# Patient Record
Sex: Male | Born: 1995 | Race: Black or African American | Hispanic: No | Smoking: Never smoker
Health system: Southern US, Community
[De-identification: ages and names within clinical notes are randomized; demographics above are authoritative.]

## PROBLEM LIST (undated history)

## (undated) DIAGNOSIS — J45909 Unspecified asthma, uncomplicated: Secondary | ICD-10-CM

## (undated) DIAGNOSIS — F988 Other specified behavioral and emotional disorders with onset usually occurring in childhood and adolescence: Secondary | ICD-10-CM

## (undated) DIAGNOSIS — L309 Dermatitis, unspecified: Secondary | ICD-10-CM

## (undated) HISTORY — DX: Dermatitis, unspecified: L30.9

## (undated) HISTORY — DX: Other specified behavioral and emotional disorders with onset usually occurring in childhood and adolescence: F98.8

---
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
## 2015-01-24 ENCOUNTER — Other Ambulatory Visit: Payer: Self-pay | Admitting: Internal Medicine

## 2015-01-26 NOTE — Telephone Encounter (Signed)
Denied.  Needs an appointment.  Pt's mother has been contacted in the past.

## 2015-01-30 ENCOUNTER — Other Ambulatory Visit: Payer: Self-pay | Admitting: Internal Medicine

## 2015-01-30 NOTE — Telephone Encounter (Signed)
Denied.  Pt needs to be seen in the office.

## 2015-07-29 ENCOUNTER — Telehealth: Payer: Self-pay | Admitting: Internal Medicine

## 2015-07-29 MED ORDER — ALBUTEROL SULFATE HFA 108 (90 BASE) MCG/ACT IN AERS: INHALATION_SPRAY | RESPIRATORY_TRACT | Status: DC

## 2015-07-29 NOTE — Telephone Encounter (Signed)
Please see message and advise 
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
## 2015-07-29 NOTE — Telephone Encounter (Signed)
He has not been seen in over a year  Can refill albuterol x 1     Needs ov before  More refills    Have  Ov scheduled  He is now 19 make sure there is a DRp in system.    .Marland Kitchen

## 2015-07-29 NOTE — Telephone Encounter (Signed)
Called and got pt's mobile number from mother due to no DPR in chart. Called [phone] and left message to call office. Rx sent to pharmacy, pt needs to schedule appt.

## 2015-07-30 NOTE — Telephone Encounter (Signed)
Left message on voicemail to call office.  

## 2015-09-16 ENCOUNTER — Telehealth: Payer: Self-pay | Admitting: Internal Medicine
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
## 2015-09-16 NOTE — Telephone Encounter (Signed)
Should go to ed  I agree.

## 2015-09-16 NOTE — Telephone Encounter (Signed)
Patient & his mom are on the way to [facility] ED.  See below.

## 2015-09-16 NOTE — Telephone Encounter (Signed)
No ED note in the EHR.  Left a message for a return call.

## 2015-11-12 ENCOUNTER — Ambulatory Visit (HOSPITAL_COMMUNITY)
Admission: EM | Admit: 2015-11-12 | Discharge: 2015-11-12 | Disposition: A | Discharge status: Home / Self Care | Payer: Federal, State, Local not specified - PPO | Attending: Family Medicine | Admitting: Family Medicine

## 2015-11-12 ENCOUNTER — Ambulatory Visit (INDEPENDENT_AMBULATORY_CARE_PROVIDER_SITE_OTHER): Payer: Federal, State, Local not specified - PPO

## 2015-11-12 ENCOUNTER — Encounter (HOSPITAL_COMMUNITY): Payer: Self-pay

## 2015-11-12 DIAGNOSIS — S6291XA Unspecified fracture of right wrist and hand, initial encounter for closed fracture: Secondary | ICD-10-CM

## 2015-11-12 MED ORDER — CEPHALEXIN 500 MG PO CAPS: 500.0000 mg | ORAL_CAPSULE | Freq: Three times a day (TID) | ORAL | Status: DC

## 2015-11-12 MED ORDER — MUPIROCIN CALCIUM 2 % EX CREA
1.0000 | TOPICAL_CREAM | Freq: Two times a day (BID) | CUTANEOUS | Status: DC
Start: 2015-11-12 — End: 2016-04-29
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
## 2015-11-12 NOTE — Progress Notes (Signed)
Orthopedic Tech Progress Note Patient Details:  Randall Good 1996-04-07 [id number]  Ortho Devices Type of Ortho Device: Ulna gutter splint Ortho Device/Splint Location: rue Ortho Device/Splint Interventions: Ordered, Application   Randall Good, Randall Good 11/12/2015, 9:26 PM

## 2015-11-12 NOTE — Discharge Instructions (Signed)
Cast or Splint Care °Casts and splints support injured limbs and keep bones from moving while they heal. It is important to care for your cast or splint at home.   °HOME CARE INSTRUCTIONS °· Keep the cast or splint uncovered during the drying period. It can take 24 to 48 hours to dry if it is made of plaster. A fiberglass cast will dry in less than 1 hour. °· Do not rest the cast on anything harder than a pillow for the first 24 hours. °· Do not put weight on your injured limb or apply pressure to the cast until your health care provider gives you permission. °· Keep the cast or splint dry. Wet casts or splints can lose their shape and may not support the limb as well. A wet cast that has lost its shape can also create harmful pressure on your skin when it dries. Also, wet skin can become infected. °¨ Cover the cast or splint with a plastic bag when bathing or when out in the rain or snow. If the cast is on the trunk of the body, take sponge baths until the cast is removed. °¨ If your cast does become wet, dry it with a towel or a blow dryer on the cool setting only. °· Keep your cast or splint clean. Soiled casts may be wiped with a moistened cloth. °· Do not place any hard or soft foreign objects under your cast or splint, such as cotton, toilet paper, lotion, or powder. °· Do not try to scratch the skin under the cast with any object. The object could get stuck inside the cast. Also, scratching could lead to an infection. If itching is a problem, use a blow dryer on a cool setting to relieve discomfort. °· Do not trim or cut your cast or remove padding from inside of it. °· Exercise all joints next to the injury that are not immobilized by the cast or splint. For example, if you have a long leg cast, exercise the hip joint and toes. If you have an arm cast or splint, exercise the shoulder, elbow, thumb, and fingers. °· Elevate your injured arm or leg on 1 or 2 pillows for the first 1 to 3 days to decrease  swelling and pain. It is best if you can comfortably elevate your cast so it is higher than your heart. °SEEK MEDICAL CARE IF:  °· Your cast or splint cracks. °· Your cast or splint is too tight or too loose. °· You have unbearable itching inside the cast. °· Your cast becomes wet or develops a soft spot or area. °· You have a bad smell coming from inside your cast. °· You get an object stuck under your cast. °· Your skin around the cast becomes red or raw. °· You have new pain or worsening pain after the cast has been applied. °SEEK IMMEDIATE MEDICAL CARE IF:  °· You have fluid leaking through the cast. °· You are unable to move your fingers or toes. °· You have discolored (blue or white), cool, painful, or very swollen fingers or toes beyond the cast. °· You have tingling or numbness around the injured area. °· You have severe pain or pressure under the cast. °· You have any difficulty with your breathing or have shortness of breath. °· You have chest pain. °  °This information is not intended to replace advice given to you by your health care provider. Make sure you discuss any questions you have with your health care   provider. °  °Document Released: 07/15/2000 Document Revised: 05/08/2013 Document Reviewed: 01/24/2013 °Elsevier Interactive Patient Education ©2016 Elsevier Inc. ° °Metacarpal Fracture °Fractures of metacarpals are breaks in the bones of the hand. They extend from the knuckles to the wrist. These bones can break in many ways. There are different ways of treating these fractures. °HOME CARE °· Only exercise as told by your doctor. °· Return to activities as told by your doctor. °· Go to physical therapy as told by your doctor. °· Follow your doctor's advice about driving. °· Keep the injured hand raised (elevated) above the level of your heart. °· If a plaster, fiberglass, or pre-formed splint was applied: °¨ Wear your splint as told and until you are examined again. °¨ Apply ice on the injury for  15-20 minutes at a time, 03-04 times a day. Put the ice in a plastic bag. Place a towel between your skin and the bag. °¨ Do not get your splint or cast wet. Protect it during bathing with a plastic bag. °¨ Loosen the elastic bandage around the splint if your fingers start to get numb, tingle, get cold, or turn blue. °¨ If the splint is plaster, do not lean it on hard surfaces or put pressure on it for 24 hours after it is put on. °¨ Do not  try to scratch the skin under the cast. °¨ Check the skin around the cast every day. You may put lotion on red or sore areas. °¨ Move the fingers of your casted hand several times a day. °· Only take medicine as told by your doctor. °· Follow up as told by your doctor. This is very important in order to avoid permanent injury, disability, or lasting (chronic) pain. °GET HELP RIGHT AWAY IF:  °· You develop a rash. °· You have problems breathing. °· You have any allergy problems. °· You have more than a small spot of blood from beneath your cast or splint. °· You have redness, puffiness (swelling), or more pain from beneath your cast or splint. °· Yellowish-white fluid (pus) comes from beneath your cast or splint. °· You develop a temperature by mouth above 102° F (38.9° C), not controlled by medicine. °· You have a bad smell coming from under your cast or splint. °· You have problems moving any of your fingers. °If you do not have a window in your cast for looking at the wound, a fluid or a little bleeding may show up as a stain on the outside of your cast. Tell your doctor about any stains you see. °MAKE SURE YOU:  °· Understand these instructions. °· Will watch your condition. °· Will get help right away if you are not doing well or get worse. °  °This information is not intended to replace advice given to you by your health care provider. Make sure you discuss any questions you have with your health care provider. °  °Document Released: 01/04/2008 Document Revised: 08/08/2014  Document Reviewed: 05/07/2014 °Elsevier Interactive Patient Education ©2016 Elsevier Inc. ° °
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
## 2015-11-16 ENCOUNTER — Ambulatory Visit: Payer: Federal, State, Local not specified - PPO | Admitting: Internal Medicine

## 2015-11-18 ENCOUNTER — Emergency Department (HOSPITAL_COMMUNITY)
Admission: EM | Admit: 2015-11-18 | Discharge: 2015-11-18 | Disposition: A | Discharge status: Home / Self Care | Payer: Federal, State, Local not specified - PPO | Attending: Emergency Medicine | Admitting: Emergency Medicine

## 2015-11-18 ENCOUNTER — Encounter (HOSPITAL_COMMUNITY): Payer: Self-pay | Admitting: Emergency Medicine

## 2015-11-18 DIAGNOSIS — Y9389 Activity, other specified: Secondary | ICD-10-CM | POA: Diagnosis not present

## 2015-11-18 DIAGNOSIS — Z23 Encounter for immunization: Secondary | ICD-10-CM | POA: Diagnosis not present

## 2015-11-18 DIAGNOSIS — S60521A Blister (nonthermal) of right hand, initial encounter: Secondary | ICD-10-CM | POA: Insufficient documentation

## 2015-11-18 DIAGNOSIS — Z792 Long term (current) use of antibiotics: Secondary | ICD-10-CM | POA: Insufficient documentation

## 2015-11-18 DIAGNOSIS — Z8709 Personal history of other diseases of the respiratory system: Secondary | ICD-10-CM | POA: Insufficient documentation

## 2015-11-18 DIAGNOSIS — Y998 Other external cause status: Secondary | ICD-10-CM | POA: Diagnosis not present

## 2015-11-18 DIAGNOSIS — S60511A Abrasion of right hand, initial encounter: Secondary | ICD-10-CM | POA: Insufficient documentation

## 2015-11-18 DIAGNOSIS — Z872 Personal history of diseases of the skin and subcutaneous tissue: Secondary | ICD-10-CM | POA: Diagnosis not present

## 2015-11-18 DIAGNOSIS — Z8659 Personal history of other mental and behavioral disorders: Secondary | ICD-10-CM | POA: Diagnosis not present

## 2015-11-18 DIAGNOSIS — L089 Local infection of the skin and subcutaneous tissue, unspecified: Secondary | ICD-10-CM | POA: Diagnosis present

## 2015-11-18 DIAGNOSIS — Z79899 Other long term (current) drug therapy: Secondary | ICD-10-CM | POA: Insufficient documentation

## 2015-11-18 DIAGNOSIS — Y9289 Other specified places as the place of occurrence of the external cause: Secondary | ICD-10-CM | POA: Diagnosis not present

## 2015-11-18 MED ORDER — CEPHALEXIN 250 MG PO CAPS
1000.0000 mg | ORAL_CAPSULE | Freq: Once | ORAL | Status: AC
  Administered 2015-11-18: 1000 mg via ORAL
  Filled 2015-11-18: qty 4

## 2015-11-18 MED ORDER — CEPHALEXIN 500 MG PO CAPS: 1000.0000 mg | ORAL_CAPSULE | Freq: Two times a day (BID) | ORAL | Status: DC

## 2015-11-18 MED ORDER — TETANUS-DIPHTH-ACELL PERTUSSIS 5-2.5-18.5 LF-MCG/0.5 IM SUSP
0.5000 mL | Freq: Once | INTRAMUSCULAR | Status: AC
  Administered 2015-11-18: 0.5 mL via INTRAMUSCULAR
  Filled 2015-11-18: qty 0.5

## 2015-11-18 NOTE — Discharge Instructions (Signed)
Please take antibiotics as prescribed.  Wear splint for protection.  Follow up with hand specialist in 1 week.  Return if you have any concerns.  Blisters A blister is a fluid-filled sac that forms between layers of skin. Blisters often form in areas where skin rubs against other skin or rubs against something else. The most common areas for blisters are the hands and feet. CAUSES A blister can be caused by:  An injury.  A burn.  An allergic reaction.  An infection.  Exposure to irritating chemicals.  Friction. Friction blisters often result from:  Sports.  Repetitive activities.  Shoes that are too tight or too loose. SIGNS AND SYMPTOMS A blister is often round and looks like a bump. It may itch or be painful to the touch. The liquid in a blister is clear or bloody. Before a blister forms, the skin may become red, feel warm, itch, or be painful to the touch. DIAGNOSIS A blister can usually be diagnosed from its appearance. TREATMENT Treatment involves protecting the area where the blister has formed until the skin has healed. If something is likely to rub against the blister, apply a bandage (dressing) with a hole in the middle over the blister. Most blisters break open, dry up, and go away on their own within 10 days. Rarely, blisters that are very painful may be drained before they break open on their own. Draining of a blister should only be done by a health care provider under sterile conditions. HOME CARE INSTRUCTIONS  Protect the area where the blister has formed as directed by your health care provider.  Do not open or pop your blister, because it could become infected.  If the blister is very painful, ask your health care provider whether you should have it drained.  If the blister breaks open on its own:  Do not remove the loose skin that is over the blister.  Wash the blister area with soap and water every day.  After washing the blister area, you may apply an  antibiotic cream or ointment and cover the area with a bandage. PREVENTION Taking these steps can help to prevent blisters that are caused by friction:  Wear comfortable shoes that fit well.  Always wear socks with shoes.  Wear extra socks or use tape, bandages, or pads over blister-prone areas as needed.  Wear protective gear, such as gloves, when participating in sports or activities that can cause blisters.  Use powders as needed to keep your feet dry. SEEK MEDICAL CARE IF:  You have increased redness, swelling, or pain in the blister area.  A puslike discharge is coming from the blister area.  You have a fever.  You have chills.   This information is not intended to replace advice given to you by your health care provider. Make sure you discuss any questions you have with your health care provider.   Document Released: 08/25/2004 Document Revised: 08/08/2014 Document Reviewed: 02/15/2014 Elsevier Interactive Patient Education [employer]2016 Elsevier Inc.

## 2015-11-18 NOTE — Progress Notes (Signed)
Orthopedic Tech Progress Note Patient Details:  Randall Good 14-Jul-1996 [id number]  Ortho Devices Type of Ortho Device: Ace wrap, Ulna gutter splint Ortho Device/Splint Location: RUE Ortho Device/Splint Interventions: Ordered, Application   Jennye MoccasinHughes, Randall Good 11/18/2015, 11:22 PM

## 2015-11-18 NOTE — ED Notes (Addendum)
Ortho tech at bedside 
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
## 2015-11-18 NOTE — ED Notes (Signed)
No answer

## 2015-11-18 NOTE — ED Notes (Signed)
Pt. reports infected skin abrasions at right hand with redness/drainage onset this week .

## 2015-11-18 NOTE — ED Notes (Signed)
Ortho tech notified of need for splint 

## 2016-04-25 ENCOUNTER — Ambulatory Visit: Payer: Federal, State, Local not specified - PPO | Admitting: Internal Medicine

## 2016-04-25 ENCOUNTER — Telehealth: Payer: Self-pay | Admitting: Family Medicine

## 2016-04-25 DIAGNOSIS — Z0289 Encounter for other administrative examinations: Secondary | ICD-10-CM

## 2016-04-25 NOTE — Progress Notes (Deleted)
   No chief complaint on file.   HPI: Randall Good 20 y.o.  Last visit 12 15  ROS: See pertinent positives and negatives per HPI.  Past Medical History:  Diagnosis Date  . ADD (attention deficit disorder)    possible  no eval in record  . Allergic rhinitis     dr Barnetta Chapelwhelan in the past  . Eczema     Family History  Problem Relation Age of Onset  . Bipolar disorder Father   . Asthma Father   . Asthma Brother     Social History   Social History  . Marital status: Single    Spouse name: N/A  . Number of children: N/A  . Years of education: N/A   Social History Main Topics  . Smoking status: Never Smoker  . Smokeless tobacco: Never Used  . Alcohol use No  . Drug use: No  . Sexual activity: Yes    Birth control/ protection: Condom   Other Topics Concern  . Not on file   Social History Narrative   Parents divorced  Father [place]   HH of 3   No pets   Football   12h grade [employer]    Outpatient Medications Prior to Visit  Medication Sig Dispense Refill  . albuterol (PROVENTIL HFA) 108 (90 Base) MCG/ACT inhaler INHALE 2 PUFFS BY MOUTH INTO THE LUNGS EVERY 6 HOURS AS NEEDED FOR WHEEZING 6.7 g 0  . cephALEXin (KEFLEX) 500 MG capsule Take 2 capsules (1,000 mg total) by mouth 2 (two) times daily. 40 capsule 0  . mupirocin cream (BACTROBAN) 2 % Apply 1 application topically 2 (two) times daily. 15 g 0   No facility-administered medications prior to visit.      EXAM:  There were no vitals taken for this visit.  There is no height or weight on file to calculate BMI.  GENERAL: vitals reviewed and listed above, alert, oriented, appears well hydrated and in no acute distress HEENT: atraumatic, conjunctiva  clear, no obvious abnormalities on inspection of external nose and ears OP : no lesion edema or exudate  NECK: no obvious masses on inspection palpation  LUNGS: clear to auscultation bilaterally, no wheezes, rales or rhonchi, good air movement CV: HRRR, no  clubbing cyanosis or  peripheral edema nl cap refill  MS: moves all extremities without noticeable focal  abnormality PSYCH: pleasant and cooperative, no obvious depression or anxiety  ASSESSMENT AND PLAN:  Discussed the following assessment and plan:  No diagnosis found.  -Patient advised to return or notify health care team  if symptoms worsen ,persist or new concerns arise.  There are no Patient Instructions on file for this visit.   Neta MendsWanda K. Suhaib Guzzo M.D.

## 2016-04-25 NOTE — Telephone Encounter (Signed)
Spoke to the pt.  He thought appointment time was at 4:30 PM today. Informed pt that Dr. Fabian SharpPanosh was full for the afternoon.  He will check his schedule and call back to make new appointment.

## 2016-04-26 NOTE — Telephone Encounter (Signed)
Noted.  Pt to call back for appointment.

## 2016-04-26 NOTE — Telephone Encounter (Signed)
Ok to  Work him in at end of day  appt

## 2016-04-29 ENCOUNTER — Encounter (HOSPITAL_COMMUNITY): Payer: Self-pay | Admitting: Emergency Medicine

## 2016-04-29 ENCOUNTER — Emergency Department (HOSPITAL_COMMUNITY)
Admission: EM | Admit: 2016-04-29 | Discharge: 2016-04-29 | Disposition: A | Discharge status: Home / Self Care | Payer: Federal, State, Local not specified - PPO | Attending: Emergency Medicine | Admitting: Emergency Medicine

## 2016-04-29 DIAGNOSIS — J45901 Unspecified asthma with (acute) exacerbation: Secondary | ICD-10-CM | POA: Insufficient documentation

## 2016-04-29 DIAGNOSIS — R05 Cough: Secondary | ICD-10-CM | POA: Diagnosis present

## 2016-04-29 HISTORY — DX: Unspecified asthma, uncomplicated: J45.909

## 2016-04-29 MED ORDER — ALBUTEROL SULFATE HFA 108 (90 BASE) MCG/ACT IN AERS
1.0000 | INHALATION_SPRAY | Freq: Four times a day (QID) | RESPIRATORY_TRACT | 1 refills | Status: DC | PRN
Start: 2016-04-29 — End: 2017-08-15

## 2016-04-29 MED ORDER — ALBUTEROL SULFATE HFA 108 (90 BASE) MCG/ACT IN AERS
2.0000 | INHALATION_SPRAY | Freq: Once | RESPIRATORY_TRACT | Status: AC
  Administered 2016-04-29: 2 via RESPIRATORY_TRACT
  Filled 2016-04-29: qty 6.7

## 2016-04-29 MED ORDER — IPRATROPIUM-ALBUTEROL 0.5-2.5 (3) MG/3ML IN SOLN
3.0000 mL | Freq: Once | RESPIRATORY_TRACT | Status: AC
Start: 2016-04-29 — End: 2016-04-29
  Administered 2016-04-29: 3 mL via RESPIRATORY_TRACT
  Filled 2016-04-29: qty 3

## 2016-04-29 MED ORDER — ALBUTEROL SULFATE (2.5 MG/3ML) 0.083% IN NEBU
5.0000 mg | INHALATION_SOLUTION | Freq: Once | RESPIRATORY_TRACT | Status: AC
  Administered 2016-04-29: 5 mg via RESPIRATORY_TRACT
  Filled 2016-04-29: qty 6

## 2016-04-29 MED ORDER — PREDNISONE 20 MG PO TABS
60.0000 mg | ORAL_TABLET | Freq: Once | ORAL | Status: AC
  Administered 2016-04-29: 60 mg via ORAL
  Filled 2016-04-29: qty 3

## 2016-04-29 MED ORDER — PREDNISONE 20 MG PO TABS: ORAL_TABLET | ORAL | 0 refills | Status: DC

## 2016-04-29 NOTE — ED Notes (Signed)
MD at bedside. 

## 2016-04-29 NOTE — ED Notes (Signed)
Patient left at this time with all belongings, refused wheelchair. 

## 2016-04-29 NOTE — ED Provider Notes (Signed)
[facility]-EMERGENCY DEPT Provider Note   CSN: [id number] Arrival date & time: 04/29/16  0449     History   Chief Complaint No chief complaint on file.   HPI Randall Good is a 20 y.o. male.  HPI Patient states he has a history of asthma. He reports he has been out of his inhalers for a while. He uses only need them periodically. He reports for the past several weeks however he's been having more asthma attacks. He reports she's had cough that has come and gone. Sputum is usually clear. He denies fevers. He is not having chest pain but with tonight's episode his chest to get tight and uncomfortable. He has had one nebulizer treatment prior to my evaluation in triage. He reports he feels much better. Past Medical History:  Diagnosis Date  . ADD (attention deficit disorder)    possible  no eval in record  . Allergic rhinitis     dr Barnetta Chapel in the past  . Asthma   . Eczema     Patient Active Problem List   Diagnosis Date Noted  . Left-sided thoracic back pain 03/31/2014  . Single skin nodule 05/27/2013  . Acute gastroenteritis 09/10/2012  . Nausea vomiting and diarrhea 09/10/2012  . Back pain 09/10/2012  . Acne 02/28/2012  . Earlobe lesion skin reaction around piercing. 02/28/2012  . Hx of gastroenteritis 11/19/2011  . Recurrent headache 11/19/2011  . Breast nodule 10/21/2010  . 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 KNEE PAIN, RIGHT 10/13/2008  . ALLERGIC RHINITIS CAUSE UNSPECIFIED 06/21/2007  . ASTHMA, UNSPECIFIED, UNSPECIFIED STATUS 06/21/2007  . ECZEMA 03/13/2007    History reviewed. No pertinent surgical history.     Home Medications    Prior to Admission medications   Medication Sig Start Date End Date Taking? Authorizing Provider  albuterol (PROVENTIL HFA) 108 (90 Base) MCG/ACT inhaler INHALE 2 PUFFS BY MOUTH INTO THE LUNGS EVERY 6 HOURS AS NEEDED FOR WHEEZING 07/29/15  Yes Madelin Headings, MD  albuterol (PROVENTIL HFA;VENTOLIN HFA) 108 (90 Base) MCG/ACT inhaler  Inhale 1-2 puffs into the lungs every 6 (six) hours as needed for wheezing or shortness of breath. 04/29/16   Arby Barrette, MD  predniSONE (DELTASONE) 20 MG tablet 3 tabs po day one, then 2 po daily x 4 days 04/29/16   Arby Barrette, MD    Family History Family History  Problem Relation Age of Onset  . Bipolar disorder Father   . Asthma Father   . Asthma Brother     Social History Social History  Substance Use Topics  . Smoking status: Never Smoker  . Smokeless tobacco: Never Used  . Alcohol use No     Allergies   Review of patient's allergies indicates no known allergies.   Review of Systems Review of Systems 10 Systems reviewed and are negative for acute change except as noted in the HPI.   Physical Exam Updated Vital Signs BP 165/85   Pulse 99   Temp 98.3 F (36.8 C) (Oral)   Resp 18   Ht 6' (1.829 m)   Wt 208 lb (94.3 kg)   SpO2 96%   BMI 28.21 kg/m   Physical Exam  Constitutional: He is oriented to person, place, and time. He appears well-developed and well-nourished.  HENT:  Head: Normocephalic and atraumatic.  Nose: Nose normal.  Mouth/Throat: Oropharynx is clear and moist.  Eyes: Conjunctivae are normal.  Neck: Neck supple.  Cardiovascular: Normal rate and regular rhythm.   No murmur heard.  Pulmonary/Chest: Effort normal. No respiratory distress.  Diffuse wheezing throughout the lung fields with forced expiratory wheezes. Adequate air flow to the bases.  Abdominal: Soft. There is no tenderness.  Musculoskeletal: He exhibits no edema or tenderness.  Neurological: He is alert and oriented to person, place, and time. No cranial nerve deficit. He exhibits normal muscle tone.  Skin: Skin is warm and dry.  Psychiatric: He has a normal mood and affect.  Nursing note and vitals reviewed.    ED Treatments / Results  Labs (all labs ordered are listed, but only abnormal results are displayed) Labs Reviewed - No data to display  EKG  EKG  Interpretation None       Radiology No results found.  Procedures Procedures (including critical care time)  Medications Ordered in ED Medications  albuterol (PROVENTIL HFA;VENTOLIN HFA) 108 (90 Base) MCG/ACT inhaler 2 puff (not administered)  albuterol (PROVENTIL) (2.5 MG/3ML) 0.083% nebulizer solution 5 mg (5 mg Nebulization Given 04/29/16 0459)  predniSONE (DELTASONE) tablet 60 mg (60 mg Oral Given 04/29/16 0541)  ipratropium-albuterol (DUONEB) 0.5-2.5 (3) MG/3ML nebulizer solution 3 mL (3 mLs Nebulization Given 04/29/16 0541)     Initial Impression / Assessment and Plan / ED Course  I have reviewed the triage vital signs and the nursing notes.  Pertinent labs & imaging results that were available during my care of the patient were reviewed by me and considered in my medical decision making (see chart for details).  Clinical Course   Recheck: 06:20 patient reports feeling much better. No respiratory distress at rest. Still some expiratory wheeze present diffusely but good air flow to the bases.  Final Clinical Impressions(s) / ED Diagnoses   Final diagnoses:  Asthma exacerbation   Findings consistent with asthma exacerbation. The patient notes more cough and shortness of breath over the past week. No fevers or pleuritic chest pain. At this time plan will be to treat with a burst of prednisone and albuterol. Patient is counseled necessity follow up with his PCP for asthma monitoring and baseline management. He is also counseled on signs and symptoms for which return to the emergency department. New Prescriptions New Prescriptions   ALBUTEROL (PROVENTIL HFA;VENTOLIN HFA) 108 (90 BASE) MCG/ACT INHALER    Inhale 1-2 puffs into the lungs every 6 (six) hours as needed for wheezing or shortness of breath.   PREDNISONE (DELTASONE) 20 MG TABLET    3 tabs po day one, then 2 po daily x 4 days     Arby BarretteMarcy Arlis Yale, MD 04/29/16 [phone]

## 2016-04-29 NOTE — ED Triage Notes (Addendum)
Pt arrives with shortness of breath which he attributes to his asthma "for a while," worse this morning. States subjective fevers and cough at home. Diaphoretic in triage. NKDA. Pt reports that he does not have an inhaler.

## 2016-05-10 ENCOUNTER — Ambulatory Visit: Payer: Federal, State, Local not specified - PPO | Admitting: Internal Medicine

## 2016-05-10 DIAGNOSIS — Z0289 Encounter for other administrative examinations: Secondary | ICD-10-CM

## 2016-05-10 NOTE — Progress Notes (Deleted)
   No chief complaint on file.   HPI: Randall Good 20 y.o.  Seen in ed   9 29  ashtma flar   Missed last appt got time wrong   geiven prednisone 4 days and albuterol inhaler   Last visit with me was 2015  ROS: See pertinent positives and negatives per HPI.  Past Medical History:  Diagnosis Date  . ADD (attention deficit disorder)    possible  no eval in record  . Allergic rhinitis     dr Barnetta Chapelwhelan in the past  . Asthma   . Eczema     Family History  Problem Relation Age of Onset  . Bipolar disorder Father   . Asthma Father   . Asthma Brother     Social History   Social History  . Marital status: Single    Spouse name: N/A  . Number of children: N/A  . Years of education: N/A   Social History Main Topics  . Smoking status: Never Smoker  . Smokeless tobacco: Never Used  . Alcohol use No  . Drug use: No  . Sexual activity: Yes    Birth control/ protection: Condom   Other Topics Concern  . Not on file   Social History Narrative   Parents divorced  Father [place]   HH of 3   No pets   Football   12h grade [place]    Outpatient Medications Prior to Visit  Medication Sig Dispense Refill  . albuterol (PROVENTIL HFA) 108 (90 Base) MCG/ACT inhaler INHALE 2 PUFFS BY MOUTH INTO THE LUNGS EVERY 6 HOURS AS NEEDED FOR WHEEZING 6.7 g 0  . albuterol (PROVENTIL HFA;VENTOLIN HFA) 108 (90 Base) MCG/ACT inhaler Inhale 1-2 puffs into the lungs every 6 (six) hours as needed for wheezing or shortness of breath. 1 Inhaler 1  . predniSONE (DELTASONE) 20 MG tablet 3 tabs po day one, then 2 po daily x 4 days 11 tablet 0   No facility-administered medications prior to visit.      EXAM:  There were no vitals taken for this visit.  There is no height or weight on file to calculate BMI.  GENERAL: vitals reviewed and listed above, alert, oriented, appears well hydrated and in no acute distress HEENT: atraumatic, conjunctiva  clear, no obvious abnormalities on inspection of  external nose and ears OP : no lesion edema or exudate  NECK: no obvious masses on inspection palpation  LUNGS: clear to auscultation bilaterally, no wheezes, rales or rhonchi, good air movement CV: HRRR, no clubbing cyanosis or  peripheral edema nl cap refill  MS: moves all extremities without noticeable focal  abnormality PSYCH: pleasant and cooperative, no obvious depression or anxiety  ASSESSMENT AND PLAN:  Discussed the following assessment and plan:  Medication management  -Patient advised to return or notify health care team  if symptoms worsen ,persist or new concerns arise.  There are no Patient Instructions on file for this visit.   Neta MendsWanda K. Panosh M.D.

## 2016-08-16 ENCOUNTER — Ambulatory Visit: Payer: Federal, State, Local not specified - PPO | Admitting: Internal Medicine

## 2017-08-15 ENCOUNTER — Ambulatory Visit: Payer: Self-pay

## 2017-08-15 ENCOUNTER — Ambulatory Visit: Payer: Federal, State, Local not specified - PPO | Admitting: Internal Medicine

## 2017-08-15 ENCOUNTER — Encounter: Payer: Self-pay | Admitting: Internal Medicine

## 2017-08-15 VITALS — BP 128/82 | HR 73 | Temp 98.2°F | Wt 203.2 lb

## 2017-08-15 DIAGNOSIS — J45901 Unspecified asthma with (acute) exacerbation: Secondary | ICD-10-CM

## 2017-08-15 MED ORDER — FLUTICASONE FUROATE-VILANTEROL 200-25 MCG/INH IN AEPB: 1.0000 | INHALATION_SPRAY | Freq: Every day | RESPIRATORY_TRACT | 11 refills | Status: DC

## 2017-08-15 MED ORDER — PREDNISONE 20 MG PO TABS: 20.0000 mg | ORAL_TABLET | Freq: Two times a day (BID) | ORAL | 0 refills | Status: DC

## 2017-08-15 MED ORDER — ALBUTEROL SULFATE HFA 108 (90 BASE) MCG/ACT IN AERS: 2.0000 | INHALATION_SPRAY | Freq: Four times a day (QID) | RESPIRATORY_TRACT | 1 refills | Status: DC | PRN

## 2017-08-15 MED ORDER — IPRATROPIUM-ALBUTEROL 0.5-2.5 (3) MG/3ML IN SOLN
3.0000 mL | Freq: Once | RESPIRATORY_TRACT | Status: AC
  Administered 2017-08-15: 3 mL via RESPIRATORY_TRACT

## 2017-08-15 NOTE — Patient Instructions (Addendum)
You should probably be on controller   medication for allergy and asthma. .  Prednisone  For 5 days  begin controller inhaler ( can find out whtih is less expensive on your insurance   Albuterol as needed.   Need ROV in 3-4 weeks to decide on asthma management .   conact us if fever   Getting worse   Serious pain

## 2017-08-15 NOTE — Progress Notes (Signed)
Chief Complaint  Patient presents with  . Sinus Problem    sinus congestion, cough and wheezing x 2-3 days. Cough is very congested with greenish/yellow mucus production.    HPI: Randall Good 22 y.o.   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   [place] [employer] and seen at my job   Has no inhalers  And just ddrank hot tea andcoke.  To get through   ROS: See pertinent positives and negatives per HPI. Had fever yesterday none today ?    Missed having flu vaccine   Past Medical History:  Diagnosis Date  . ADD (attention deficit disorder)    possible  no eval in record  . Allergic rhinitis     dr Barnetta Chapelwhelan in the past  . Asthma   . Eczema    History reviewed. No pertinent surgical history.  Family History  Problem Relation Age of Onset  . Bipolar disorder Father   . Asthma Father   . Asthma Brother    Social History   Socioeconomic History  . Marital status: Single    Spouse name: Not on file  . Number of children: Not on file  . Years of education: Not on file  . Highest education level: Not on file  Social Needs  . Financial resource strain: Not on file  . Food insecurity - worry: Not on file  . Food insecurity - inability: Not on file  . Transportation needs - medical: Not on file  . Transportation needs - non-medical: Not on file  Occupational History  . Not on file  Tobacco Use  . Smoking status: Never Smoker  . Smokeless tobacco: Never Used  Substance and Sexual Activity  . Alcohol use: No  . Drug use: No  . Sexual activity: Yes    Birth control/protection: Condom  Other Topics Concern  . Not on  file  Social History Narrative   Parents divorced  Father [place]   HH of 2   Now working  [employer]   No pets   Football   12h grade [place]    Outpatient Medications Prior to Visit  Medication Sig Dispense Refill  . albuterol (PROVENTIL HFA) 108 (90 Base) MCG/ACT inhaler INHALE 2 PUFFS BY MOUTH INTO THE LUNGS EVERY 6 HOURS AS NEEDED FOR WHEEZING 6.7 g 0  . albuterol (PROVENTIL HFA;VENTOLIN HFA) 108 (90 Base) MCG/ACT inhaler Inhale 1-2 puffs into the lungs every 6 (six) hours as needed for wheezing or shortness of breath. (Patient not taking: Reported on 08/15/2017) 1 Inhaler 1  . predniSONE (DELTASONE) 20 MG tablet 3 tabs po day one, then 2 po daily x 4 days (Patient not taking: Reported on 08/15/2017) 11 tablet 0   No facility-administered medications prior to visit.      EXAM:  BP 128/82 (BP Location: Right Arm, Patient Position: Sitting, Cuff Size: Normal)   Pulse 73   Temp 98.2 F (  36.8 C) (Oral)   Wt 203 lb 3.2 oz (92.2 kg)   SpO2 98%   BMI 27.56 kg/m   Body mass index is 27.56 kg/m.  GENERAL: vitals reviewed and listed above, alert, oriented, appears well hydrated and in no acute distress  obv wheezy bronchial cough   Nl color  Looks allerguic some ur congestion and no face pain  HEENT: atraumatic, conjunctiva  clear, no obvious abnormalities on inspection of external nose and ears tm some wax  Nl lm  OP : no lesion edema or exudate  NECK: no obvious masses on inspection palpation  LUNGS: bilateral wheezes   Musical sounds   impr  After  Nebulizer   No rales ? Rhinchi?  CV: HRRR, no clubbing cyanosis or  peripheral edema nl cap refill  MS: moves all extremities without noticeable focal  abnormality PSYCH: pleasant and cooperative, no obvious depression or anxiety  ASSESSMENT AND PLAN:  Discussed the following assessment and plan:  Asthma with acute exacerbation, unspecified asthma severity, unspecified whether persistent - Plan: ipratropium-albuterol (DUONEB) 0.5-2.5 (3)  MG/3ML nebulizer solution 3 mL prob viral resp infection and environmental allergy triggers    pred  Controller inhaler and albuterola s needed  Plan fu in a month for med management or as needed  Let us know if not getting better , fevers  Etc  In the interim   Or cost constraints of meds  Should get the flu vaccine  Low threshold to reeval x ray etc if  Fever etc  -Patient advised to return or notify health care team  if symptoms worsen ,persist or new concerns arise.  Patient Instructions  You should probably be on controller   medication for allergy and asthma. .  Prednisone  For 5 days  begin controller inhaler ( can find out whtih is less expensive on your insurance   Albuterol as needed.   Need ROV in 3-4 weeks to decide on asthma management .   conact [place] if fever   Getting worse   Serious pain       Burna Mortimer K. Kalifa Cadden M.D.

## 2017-08-15 NOTE — Telephone Encounter (Signed)
Patient called in with c/o "sinus congestion, cough and wheezing." He states "I had a headache the past few days, but it went away. I had some tenderness to my sinuses, but now it's not there. I have been congested since New Years, but it seems to get better and come back." I asked is he having pain now, he said "my stomach hurts when I cough, 5-6 pain." I asked what color was the sinus drainage and if he is coughing up anything, he says "when I blow my nose, it's yellowish-green and has been like this for a few days. When I cough it is yellowish-green too." I asked is he running a fever, he said "I don't have a thermometer. But at work, I went to the nurse and she said because I was sweating in the cold with cold chills, I had a fever. This was 2 nights ago. I didn't go to work last night because I just didn't feel like doing anything and I've been in the bed all morning." I asked was he having any other symptoms, he said just not wanting to eat anything. According to protocol, see PCP within 24 hours, appointment made for today, care advice given, verbalized understanding.   Reason for Disposition . [1] Fever returns after gone for over 24 hours AND [2] symptoms worse or not improved  Answer Assessment - Initial Assessment Questions 1. LOCATION: "Where does it hurt?"      Headache, sinus tenderness 2. ONSET: "When did the sinus pain start?"  (e.g., hours, days)      1 week 3. SEVERITY: "How bad is the pain?"   (Scale 1-10; mild, moderate or severe)   - MILD (1-3): doesn't interfere with normal activities    - MODERATE (4-7): interferes with normal activities (e.g., work or school) or awakens from sleep   - SEVERE (8-10): excruciating pain and patient unable to do any normal activities        5-6 stomach 4. RECURRENT SYMPTOM: "Have you ever had sinus problems before?" If so, ask: "When was the last time?" and "What happened that time?"      Yes-08/01/17 5. NASAL CONGESTION: "Is the nose blocked?" If  so, ask, "Can you open it or must you breathe through the mouth?"     Mouth 6. NASAL DISCHARGE: "Do you have discharge from your nose?" If so ask, "What color?"     Yellowish-green 7. FEVER: "Do you have a fever?" If so, ask: "What is it, how was it measured, and when did it start?"      Not sure, did not check, but the nurse at work said I did because of the sweating at night, cold chills 8. OTHER SYMPTOMS: "Do you have any other symptoms?" (e.g., sore throat, cough, earache, difficulty breathing)     Cough with wheezing, congested cough yellowish-green mucus, loss of appetite, not wanting to do anything 9. PREGNANCY: "Is there any chance you are pregnant?" "When was your last menstrual period?"     N/A  Protocols used: SINUS PAIN OR CONGESTION-A-AH

## 2017-12-04 ENCOUNTER — Emergency Department (HOSPITAL_COMMUNITY)
Admission: EM | Admit: 2017-12-04 | Discharge: 2017-12-05 | Discharge status: Against Medical Advice | Payer: Federal, State, Local not specified - PPO

## 2017-12-11 ENCOUNTER — Ambulatory Visit (INDEPENDENT_AMBULATORY_CARE_PROVIDER_SITE_OTHER): Payer: Federal, State, Local not specified - PPO | Admitting: Family Medicine

## 2017-12-11 ENCOUNTER — Encounter: Payer: Self-pay | Admitting: Family Medicine

## 2017-12-11 VITALS — BP 120/70 | HR 80 | Temp 98.4°F | Wt 203.1 lb

## 2017-12-11 DIAGNOSIS — J36 Peritonsillar abscess: Secondary | ICD-10-CM

## 2017-12-11 DIAGNOSIS — J020 Streptococcal pharyngitis: Secondary | ICD-10-CM

## 2017-12-11 MED ORDER — AMOXICILLIN-POT CLAVULANATE 400-57 MG/5ML PO SUSR: ORAL | 0 refills | Status: DC

## 2017-12-11 NOTE — Progress Notes (Signed)
  Subjective:     Patient ID: Randall Good, male   DOB: [birth year], 22 y.o.   MRN: [id number]  HPI Patient seen with persistent mostly left-sided sore throat. History is that 8 days ago he went to urgent care with sore throat. He states he had positive strep culture. He apparently had initial negative rapid strep and was called couple days later with positive culture. He had already been placed already on prednisone and Zithromax. Was not aware of any fever. Did not really improve much and by lastTuesday was having some difficulty swallowing. He was at a friend's house and reports that EMS was called. By the time they arrived he was doing better with swallowing and he declined going in for further evaluation.  He has not had any cough. No headache. No chills. Patient completed course of Zithromax. Still has soreness left tonsillar region but overall he states he is much better than he was couple days ago. Swallowing better. Keeping down fluids. Eating soft foods such as ice cream  Past Medical History:  Diagnosis Date  . ADD (attention deficit disorder)    possible  no eval in record  . Allergic rhinitis     dr Barnetta Chapel in the past  . Asthma   . Eczema    No past surgical history on file.  reports that he has never smoked. He has never used smokeless tobacco. He reports that he does not drink alcohol or use drugs. family history includes Asthma in his brother and father; Bipolar disorder in his father. No Known Allergies   Review of Systems  Constitutional: Negative for chills and fever.  HENT: Positive for sore throat. Negative for congestion.   Respiratory: Negative for cough.   Neurological: Negative for headaches.  Hematological: Positive for adenopathy.       Objective:   Physical Exam  Constitutional: He appears well-developed and well-nourished.  HENT:  Right Ear: External ear normal.  Left Ear: External ear normal.  Patient has significant edema and redness involving  left tonsil. Right side is relatively normal. He has some mild erythema involving the soft palate on the left side but minimal swelling. No visible exudate  Neck: Neck supple.  Prominent anterior cervical adenopathy left side greater than right  Cardiovascular: Normal rate and regular rhythm.  Pulmonary/Chest: Effort normal and breath sounds normal. He has no wheezes. He has no rales.  Skin: No rash noted.       Assessment:     Acute pharyngitis with reported recent positive strep. Patient was treated with Zithromax and has had some slow improvement over the past few days.  Suspect he has some left peritonsillar cellulitis and question resolving peritonsillar abscess.    Plan:     -Advil 400 to 800 mg every 8 hours as needed for pain and discomfort -Continue with things like popsicles, ice chips, and plenty of fluids for hydration -Would go with broader coverage with Augmentin. Patient requesting liquid. Will do 400 mg per 5 mL suspension 2 teaspoons 3 times a day for 7 days -Was written out of work for the next 3 days with tentative return date of 12/14/17. He also recommended follow-up with primary either tomorrow or early Wednesday to reassess  Kristian Covey MD [facility]

## 2017-12-11 NOTE — Patient Instructions (Signed)
Take some Advil 400 to 800 mg every 8 hours as needed for pain.  Try some ice chips or popsicles.

## 2017-12-12 ENCOUNTER — Ambulatory Visit: Payer: Federal, State, Local not specified - PPO | Admitting: Internal Medicine

## 2017-12-12 NOTE — Progress Notes (Deleted)
No chief complaint on file.   HPI: Randall Good 22 y.o. come in forfu   Tonsillitis and poss peritonsillar cellulitis rx with z pack and now augmentin   ROS: See pertinent positives and negatives per HPI.  Past Medical History:  Diagnosis Date  . ADD (attention deficit disorder)    possible  no eval in record  . Allergic rhinitis     dr Barnetta Chapel in the past  . Asthma   . Eczema     Family History  Problem Relation Age of Onset  . Bipolar disorder Father   . Asthma Father   . Asthma Brother     Social History   Socioeconomic History  . Marital status: Single    Spouse name: Not on file  . Number of children: Not on file  . Years of education: Not on file  . Highest education level: Not on file  Occupational History  . Not on file  Social Needs  . Financial resource strain: Not on file  . Food insecurity:    Worry: Not on file    Inability: Not on file  . Transportation needs:    Medical: Not on file    Non-medical: Not on file  Tobacco Use  . Smoking status: Never Smoker  . Smokeless tobacco: Never Used  Substance and Sexual Activity  . Alcohol use: No  . Drug use: No  . Sexual activity: Yes    Birth control/protection: Condom  Lifestyle  . Physical activity:    Days per week: Not on file    Minutes per session: Not on file  . Stress: Not on file  Relationships  . Social connections:    Talks on phone: Not on file    Gets together: Not on file    Attends religious service: Not on file    Active member of club or organization: Not on file    Attends meetings of clubs or organizations: Not on file    Relationship status: Not on file  Other Topics Concern  . Not on file  Social History Narrative   Parents divorced  Father [place]   HH of 2   Now working  [employer]   No pets   Football   12h grade [employer]    Outpatient Medications Prior to Visit  Medication Sig Dispense Refill  . albuterol (PROVENTIL HFA) 108 (90 Base) MCG/ACT inhaler INHALE 2  PUFFS BY MOUTH INTO THE LUNGS EVERY 6 HOURS AS NEEDED FOR WHEEZING 6.7 g 0  . albuterol (PROVENTIL HFA;VENTOLIN HFA) 108 (90 Base) MCG/ACT inhaler Inhale 2 puffs into the lungs every 6 (six) hours as needed for wheezing or shortness of breath. 1 Inhaler 1  . amoxicillin-clavulanate (AUGMENTIN) 400-57 MG/5ML suspension Take two teaspoons three times daily for 7 days. 210 mL 0  . fluticasone furoate-vilanterol (BREO ELLIPTA) 200-25 MCG/INH AEPB Inhale 1 puff into the lungs daily. For asthma  prevention 1 each 11   No facility-administered medications prior to visit.      EXAM:  There were no vitals taken for this visit.  There is no height or weight on file to calculate BMI.  GENERAL: vitals reviewed and listed above, alert, oriented, appears well hydrated and in no acute distress HEENT: atraumatic, conjunctiva  clear, no obvious abnormalities on inspection of external nose and ears OP : no lesion edema or exudate  NECK: no obvious masses on inspection palpation  LUNGS: clear to auscultation bilaterally, no wheezes, rales or  rhonchi, good air movement CV: HRRR, no clubbing cyanosis or  peripheral edema nl cap refill  MS: moves all extremities without noticeable focal  abnormality PSYCH: pleasant and cooperative, no obvious depression or anxiety Lab Results  Component Value Date   WBC 7.3 09/29/2009   HGB 12.8 (L) 09/29/2009   HCT 39.3 09/29/2009   PLT 345.0 09/29/2009   GLUCOSE 92 09/29/2009   CHOL 152 09/29/2009   TRIG 107.0 09/29/2009   HDL 51.20 09/29/2009   LDLCALC 79 09/29/2009   ALT 20 09/29/2009   AST 24 09/29/2009   NA 138 09/29/2009   K 3.9 09/29/2009   CL 102 09/29/2009   CREATININE 0.7 09/29/2009   BUN 13 09/29/2009   CO2 27 09/29/2009   TSH 2.59 09/29/2009   HGBA1C 5.8 09/29/2009   BP Readings from Last 3 Encounters:  12/11/17 120/70  08/15/17 128/82  04/29/16 165/85    ASSESSMENT AND PLAN:  Discussed the following assessment and plan:  No diagnosis  found.  -Patient advised to return or notify health care team  if  new concerns arise.  There are no Patient Instructions on file for this visit.   Neta Mends. Panosh M.D.

## 2017-12-13 ENCOUNTER — Ambulatory Visit: Payer: Federal, State, Local not specified - PPO | Admitting: Internal Medicine

## 2017-12-13 DIAGNOSIS — Z0289 Encounter for other administrative examinations: Secondary | ICD-10-CM

## 2017-12-15 ENCOUNTER — Ambulatory Visit: Payer: Federal, State, Local not specified - PPO | Admitting: Internal Medicine

## 2017-12-15 NOTE — Progress Notes (Deleted)
No chief complaint on file.   HPI: Randall Good 22 y.o.   Fu tonsillitis  Poss cellulitis peritonsillitis  ROS: See pertinent positives and negatives per HPI.  Past Medical History:  Diagnosis Date  . ADD (attention deficit disorder)    possible  no eval in record  . Allergic rhinitis     dr Barnetta Chapel in the past  . Asthma   . Eczema     Family History  Problem Relation Age of Onset  . Bipolar disorder Father   . Asthma Father   . Asthma Brother     Social History   Socioeconomic History  . Marital status: Single    Spouse name: Not on file  . Number of children: Not on file  . Years of education: Not on file  . Highest education level: Not on file  Occupational History  . Not on file  Social Needs  . Financial resource strain: Not on file  . Food insecurity:    Worry: Not on file    Inability: Not on file  . Transportation needs:    Medical: Not on file    Non-medical: Not on file  Tobacco Use  . Smoking status: Never Smoker  . Smokeless tobacco: Never Used  Substance and Sexual Activity  . Alcohol use: No  . Drug use: No  . Sexual activity: Yes    Birth control/protection: Condom  Lifestyle  . Physical activity:    Days per week: Not on file    Minutes per session: Not on file  . Stress: Not on file  Relationships  . Social connections:    Talks on phone: Not on file    Gets together: Not on file    Attends religious service: Not on file    Active member of club or organization: Not on file    Attends meetings of clubs or organizations: Not on file    Relationship status: Not on file  Other Topics Concern  . Not on file  Social History Narrative   Parents divorced  Father [place]   HH of 2   Now working  [employer]   No pets   Football   12h grade [employer]    Outpatient Medications Prior to Visit  Medication Sig Dispense Refill  . albuterol (PROVENTIL HFA) 108 (90 Base) MCG/ACT inhaler INHALE 2 PUFFS BY MOUTH INTO THE LUNGS EVERY 6 HOURS AS  NEEDED FOR WHEEZING 6.7 g 0  . albuterol (PROVENTIL HFA;VENTOLIN HFA) 108 (90 Base) MCG/ACT inhaler Inhale 2 puffs into the lungs every 6 (six) hours as needed for wheezing or shortness of breath. 1 Inhaler 1  . amoxicillin-clavulanate (AUGMENTIN) 400-57 MG/5ML suspension Take two teaspoons three times daily for 7 days. 210 mL 0  . fluticasone furoate-vilanterol (BREO ELLIPTA) 200-25 MCG/INH AEPB Inhale 1 puff into the lungs daily. For asthma  prevention 1 each 11   No facility-administered medications prior to visit.      EXAM:  There were no vitals taken for this visit.  There is no height or weight on file to calculate BMI.  GENERAL: vitals reviewed and listed above, alert, oriented, appears well hydrated and in no acute distress HEENT: atraumatic, conjunctiva  clear, no obvious abnormalities on inspection of external nose and ears OP : no lesion edema or exudate  NECK: no obvious masses on inspection palpation  LUNGS: clear to auscultation bilaterally, no wheezes, rales or rhonchi, good air movement CV: HRRR, no clubbing cyanosis or  peripheral edema nl cap refill  MS: moves all extremities without noticeable focal  abnormality PSYCH: pleasant and cooperative, no obvious depression or anxiety  ASSESSMENT AND PLAN:  Discussed the following assessment and plan:  No diagnosis found.  -Patient advised to return or notify health care team  if symptoms worsen ,persist or new concerns arise.  There are no Patient Instructions on file for this visit.   Standley Brooking. Laretha Luepke M.D.

## 2017-12-18 ENCOUNTER — Telehealth: Payer: Self-pay | Admitting: Family Medicine

## 2017-12-18 NOTE — Telephone Encounter (Signed)
Please clarify which days he missed.  I can do the note when I confirm days he was out.

## 2017-12-18 NOTE — Telephone Encounter (Signed)
Pt had an appt on 12/15/17 (follow up from sore throat - rescheduled from 5/15, pt had car trouble//amg)  Pt cancelled this appt stating that he had to work.  Pt is requesting a work note for days missed without having to come in and see Dr Fabian Sharp.   Please advise Dr Fabian Sharp, thanks.

## 2017-12-18 NOTE — Telephone Encounter (Signed)
Copied from CRM [id number]. Topic: General - Other >> Dec 18, 2017  2:31 PM Randall Good wrote: Reason for CRM: Patient is unable to come into the office for his excuse from work letter because he has to work. He wants to know if Dr. Fabian Sharp can write it w/I an appt and he come pick it up?

## 2017-12-18 NOTE — Telephone Encounter (Signed)
Patient was out of work 5/15 -5/16.  He also did not go to work today because there were no appointments available to come in to see his PCP.   Okay to write a note for 5/15 - 5/16 and 12/18/2017?

## 2017-12-18 NOTE — Telephone Encounter (Signed)
Per Dr Fabian Sharp see if Dr Caryl Never is able to write this letter as the patient was seen by him last week. The patient is unable to come in for a follow up with Dr Fabian Sharp but is needing a note for work to cover the days he missed last week. Pt requested that Dr Fabian Sharp write the letter but she is not the Physician that treated the patient last week.   Please advise Dr Caryl Never

## 2017-12-19 NOTE — Telephone Encounter (Signed)
Letter ready for pick up

## 2017-12-19 NOTE — Telephone Encounter (Signed)
OK 

## 2018-01-01 ENCOUNTER — Telehealth: Payer: Self-pay | Admitting: Internal Medicine

## 2018-01-01 NOTE — Telephone Encounter (Signed)
Pt aware via voicemail that the letter is up front to be picked up.  Nothing further needed.

## 2018-01-01 NOTE — Telephone Encounter (Signed)
Copied from CRM [id number]. Topic: General - Other >> Dec 18, 2017  2:31 PM Debroah LoopLander, Lumin L wrote: Reason for CRM: Patient is unable to come into the office for his excuse from work letter because he has to work. He wants to know if Dr. Fabian SharpPanosh can write it w/I an appt and he come pick it up?  >> Jan 01, 2018  3:07 PM Rudi CocoLathan, Stori Royse M, NT wrote: Pt. Calling to see if he can still come by to pick up Dr. Note (today) pt. Plans on going back to work tomorrow 01/02/18

## 2018-01-02 ENCOUNTER — Ambulatory Visit: Payer: Federal, State, Local not specified - PPO | Admitting: Adult Health

## 2018-01-02 DIAGNOSIS — Z0289 Encounter for other administrative examinations: Secondary | ICD-10-CM

## 2018-01-03 ENCOUNTER — Ambulatory Visit: Payer: Federal, State, Local not specified - PPO | Admitting: Family Medicine

## 2018-01-03 DIAGNOSIS — Z0289 Encounter for other administrative examinations: Secondary | ICD-10-CM

## 2018-01-15 ENCOUNTER — Encounter: Payer: Self-pay | Admitting: Internal Medicine

## 2018-01-15 ENCOUNTER — Ambulatory Visit: Payer: Federal, State, Local not specified - PPO | Admitting: Internal Medicine

## 2018-01-15 VITALS — BP 150/98 | HR 54 | Temp 98.2°F | Wt 211.0 lb

## 2018-01-15 DIAGNOSIS — J351 Hypertrophy of tonsils: Secondary | ICD-10-CM | POA: Diagnosis not present

## 2018-01-15 DIAGNOSIS — R030 Elevated blood-pressure reading, without diagnosis of hypertension: Secondary | ICD-10-CM

## 2018-01-15 DIAGNOSIS — J039 Acute tonsillitis, unspecified: Secondary | ICD-10-CM | POA: Diagnosis not present

## 2018-01-15 MED ORDER — AMOXICILLIN-POT CLAVULANATE 875-125 MG PO TABS: 1.0000 | ORAL_TABLET | Freq: Two times a day (BID) | ORAL | 0 refills | Status: AC

## 2018-01-15 NOTE — Progress Notes (Signed)
Chief Complaint  Patient presents with  . Sore Throat    HPI: Randall Good 22 y.o. come in for    Fu of infected throat tonsillitis  Poss cellulitis seen by Dr B  And pt failed to keep fu appt cause hehad transportation difficulties   finished med with help and  jsut minor sx throat   No fever  And last dose  Of med 6 days ago  .Marland KitchenSince then  [facility] had  Minor throat awareness  No dysphagia or fever   Needs note to return  No airway sx  works  40 hours  For past year   [employer]  All day .  ROS: See pertinent positives and negatives per HPI. asthma is quiescent   Only rare use of med  Past Medical History:  Diagnosis Date  . ADD (attention deficit disorder)    possible  no eval in record  . Allergic rhinitis     dr Barnetta Chapel in the past  . Asthma   . Eczema     Family History  Problem Relation Age of Onset  . Bipolar disorder Father   . Asthma Father   . Asthma Brother     Social History   Socioeconomic History  . Marital status: Single    Spouse name: Not on file  . Number of children: Not on file  . Years of education: Not on file  . Highest education level: Not on file  Occupational History  . Not on file  Social Needs  . Financial resource strain: Not on file  . Food insecurity:    Worry: Not on file    Inability: Not on file  . Transportation needs:    Medical: Not on file    Non-medical: Not on file  Tobacco Use  . Smoking status: Never Smoker  . Smokeless tobacco: Never Used  Substance and Sexual Activity  . Alcohol use: No  . Drug use: No  . Sexual activity: Yes    Birth control/protection: Condom  Lifestyle  . Physical activity:    Days per week: Not on file    Minutes per session: Not on file  . Stress: Not on file  Relationships  . Social connections:    Talks on phone: Not on file    Gets together: Not on file    Attends religious service: Not on file    Active member of club or organization: Not on file    Attends meetings of clubs or  organizations: Not on file    Relationship status: Not on file  Other Topics Concern  . Not on file  Social History Narrative   Parents divorced  Father [place]   HH of 2   Now working  [employer]   No pets   Football   12h grade [place]    Outpatient Medications Prior to Visit  Medication Sig Dispense Refill  . albuterol (PROVENTIL HFA) 108 (90 Base) MCG/ACT inhaler INHALE 2 PUFFS BY MOUTH INTO THE LUNGS EVERY 6 HOURS AS NEEDED FOR WHEEZING 6.7 g 0  . albuterol (PROVENTIL HFA;VENTOLIN HFA) 108 (90 Base) MCG/ACT inhaler Inhale 2 puffs into the lungs every 6 (six) hours as needed for wheezing or shortness of breath. (Patient not taking: Reported on 01/15/2018) 1 Inhaler 1  . amoxicillin-clavulanate (AUGMENTIN) 400-57 MG/5ML suspension Take two teaspoons three times daily for 7 days. (Patient not taking: Reported on 01/15/2018) 210 mL 0  . fluticasone furoate-vilanterol (BREO ELLIPTA) 200-25 MCG/INH  AEPB Inhale 1 puff into the lungs daily. For asthma  prevention (Patient not taking: Reported on 01/15/2018) 1 each 11   No facility-administered medications prior to visit.      EXAM:  BP (!) 150/98   Pulse (!) 54   Temp 98.2 F (36.8 C)   Wt 211 lb (95.7 kg)   BMI 28.62 kg/m   Body mass index is 28.62 kg/m.  GENERAL: vitals reviewed and listed above, alert, oriented, appears well hydrated and in no acute distress nl speech HEENT: atraumatic, conjunctiva  clear, no obvious abnormalities on inspection of external nose and ears tms clear OP : no lesion edema or exudate  But tonsils  2 + =+    No edema but slight red no exudate   NECK: no obvious masses on inspection small left jdg node no pc nodes   LUNGS: clear to auscultation bilaterally, no wheezes, rales or rhonchi, good air movement CV: HRRR, no clubbing cyanosis or  peripheral edema nl cap refill   MS: moves all extremities without noticeable focal  abnormality PSYCH: pleasant and cooperative, no obvious depression or anxiety Lab  Results  Component Value Date   WBC 7.3 09/29/2009   HGB 12.8 (L) 09/29/2009   HCT 39.3 09/29/2009   PLT 345.0 09/29/2009   GLUCOSE 92 09/29/2009   CHOL 152 09/29/2009   TRIG 107.0 09/29/2009   HDL 51.20 09/29/2009   LDLCALC 79 09/29/2009   ALT 20 09/29/2009   AST 24 09/29/2009   NA 138 09/29/2009   K 3.9 09/29/2009   CL 102 09/29/2009   CREATININE 0.7 09/29/2009   BUN 13 09/29/2009   CO2 27 09/29/2009   TSH 2.59 09/29/2009   HGBA1C 5.8 09/29/2009   BP Readings from Last 3 Encounters:  01/15/18 (!) 150/98  12/11/17 120/70  08/15/17 128/82    ASSESSMENT AND PLAN:  Discussed the following assessment and plan:  Enlarged tonsils  Tonsillitis - poss smoldering  better but off med for a week and minimal sx  retreat with 7 more days of meds and consider ent check he will disc with mom and call for reerra  Elevated blood pressure reading - recheck to ensure  at goal  can makle c-x appt also  reviewed  See above   Low threshold to see ent  Uncertain how much is  Baseline hypertrophy and how much is  infection  ,( has seen 3 others during the progress of  this illness) his sx are much better    As couldn't swallow pills earlier   Note for work  -Patient advised to return or notify health care team  if  new concerns arise.  Patient Instructions  You have very large tonsils and   Still may have a tinge of infection left   Take augmentin for 7 days  Ok to go back to work  On Wednesday  June 19th   consider  Getting ent consult to check your throat  And certain be seen  asap if flaring up again fever pain hard tot swallow.   Your blood pressure is high today      shoyuld be  In the 120/80 range   But certainly below 140/90  Please  Make appt for  cpx  And can check  bp at that time    How to Take Your Blood Pressure You can take your blood pressure at home with a machine. You may need to check your blood pressure at home:  To  check if you have high blood pressure  (hypertension).  To check your blood pressure over time.  To make sure your blood pressure medicine is working.  Supplies needed: You will need a blood pressure machine, or monitor. You can buy one at a drugstore or online. When choosing one:  Choose one with an arm cuff.  Choose one that wraps around your upper arm. Only one finger should fit between your arm and the cuff.  Do not choose one that measures your blood pressure from your wrist or finger.  Your doctor can suggest a monitor. How to prepare Avoid these things for 30 minutes before checking your blood pressure:  Drinking caffeine.  Drinking alcohol.  Eating.  Smoking.  Exercising.  Five minutes before checking your blood pressure:  Pee.  Sit in a dining chair. Avoid sitting in a soft couch or armchair.  Be quiet. Do not talk.  How to take your blood pressure Follow the instructions that came with your machine. If you have a digital blood pressure monitor, these may be the instructions: 1. Sit up straight. 2. Place your feet on the floor. Do not cross your ankles or legs. 3. Rest your left arm at the level of your heart. You may rest it on a table, desk, or chair. 4. Pull up your shirt sleeve. 5. Wrap the blood pressure cuff around the upper part of your left arm. The cuff should be 1 inch (2.5 cm) above your elbow. It is best to wrap the cuff around bare skin. 6. Fit the cuff snugly around your arm. You should be able to place only one finger between the cuff and your arm. 7. Put the cord inside the groove of your elbow. 8. Press the power button. 9. Sit quietly while the cuff fills with air and loses air. 10. Write down the numbers on the screen. 11. Wait 2-3 minutes and then repeat steps 1-10.  What do the numbers mean? Two numbers make up your blood pressure. The first number is called systolic pressure. The second is called diastolic pressure. An example of a blood pressure reading is "120 over 80"  (or 120/80). If you are an adult and do not have a medical condition, use this guide to find out if your blood pressure is normal: Normal  First number: below 120.  Second number: below 80. Elevated  First number: 120-129.  Second number: below 80. Hypertension stage 1  First number: 130-139.  Second number: 80-89. Hypertension stage 2  First number: 140 or above.  Second number: 90 or above. Your blood pressure is above normal even if only the top or bottom number is above normal. Follow these instructions at home:  Check your blood pressure as often as your doctor tells you to.  Take your monitor to your next doctor's appointment. Your doctor will: ? Make sure you are using it correctly. ? Make sure it is working right.  Make sure you understand what your blood pressure numbers should be.  Tell your doctor if your medicines are causing side effects. Contact a doctor if:  Your blood pressure keeps being high. Get help right away if:  Your first blood pressure number is higher than 180.  Your second blood pressure number is higher than 120. This information is not intended to replace advice given to you by your health care provider. Make sure you discuss any questions you have with your health care provider. Document Released: 06/30/2008 Document Revised: 06/15/2016 Document Reviewed:  12/25/2015 Elsevier Interactive Patient Education  2018 [employer]     WoodwayWanda K. Panosh M.D.

## 2018-01-15 NOTE — Patient Instructions (Addendum)
You have very large tonsils and   Still may have a tinge of infection left   Take augmentin for 7 days  Ok to go back to work  On Wednesday  June 19th   consider  Getting ent consult to check your throat  And certain be seen  asap if flaring up again fever pain hard tot swallow.   Your blood pressure is high today      shoyuld be  In the 120/80 range   But certainly below 140/90  Please  Make appt for  cpx  And can check  bp at that time    How to Take Your Blood Pressure You can take your blood pressure at home with a machine. You may need to check your blood pressure at home:  To check if you have high blood pressure (hypertension).  To check your blood pressure over time.  To make sure your blood pressure medicine is working.  Supplies needed: You will need a blood pressure machine, or monitor. You can buy one at a drugstore or online. When choosing one:  Choose one with an arm cuff.  Choose one that wraps around your upper arm. Only one finger should fit between your arm and the cuff.  Do not choose one that measures your blood pressure from your wrist or finger.  Your doctor can suggest a monitor. How to prepare Avoid these things for 30 minutes before checking your blood pressure:  Drinking caffeine.  Drinking alcohol.  Eating.  Smoking.  Exercising.  Five minutes before checking your blood pressure:  Pee.  Sit in a dining chair. Avoid sitting in a soft couch or armchair.  Be quiet. Do not talk.  How to take your blood pressure Follow the instructions that came with your machine. If you have a digital blood pressure monitor, these may be the instructions: 1. Sit up straight. 2. Place your feet on the floor. Do not cross your ankles or legs. 3. Rest your left arm at the level of your heart. You may rest it on a table, desk, or chair. 4. Pull up your shirt sleeve. 5. Wrap the blood pressure cuff around the upper part of your left arm. The cuff should be  1 inch (2.5 cm) above your elbow. It is best to wrap the cuff around bare skin. 6. Fit the cuff snugly around your arm. You should be able to place only one finger between the cuff and your arm. 7. Put the cord inside the groove of your elbow. 8. Press the power button. 9. Sit quietly while the cuff fills with air and loses air. 10. Write down the numbers on the screen. 11. Wait 2-3 minutes and then repeat steps 1-10.  What do the numbers mean? Two numbers make up your blood pressure. The first number is called systolic pressure. The second is called diastolic pressure. An example of a blood pressure reading is "120 over 80" (or 120/80). If you are an adult and do not have a medical condition, use this guide to find out if your blood pressure is normal: Normal  First number: below 120.  Second number: below 80. Elevated  First number: 120-129.  Second number: below 80. Hypertension stage 1  First number: 130-139.  Second number: 80-89. Hypertension stage 2  First number: 140 or above.  Second number: 90 or above. Your blood pressure is above normal even if only the top or bottom number is above normal. Follow these instructions at  home:  Check your blood pressure as often as your doctor tells you to.  Take your monitor to your next doctor's appointment. Your doctor will: ? Make sure you are using it correctly. ? Make sure it is working right.  Make sure you understand what your blood pressure numbers should be.  Tell your doctor if your medicines are causing side effects. Contact a doctor if:  Your blood pressure keeps being high. Get help right away if:  Your first blood pressure number is higher than 180.  Your second blood pressure number is higher than 120. This information is not intended to replace advice given to you by your health care provider. Make sure you discuss any questions you have with your health care provider. Document Released: 06/30/2008  Document Revised: 06/15/2016 Document Reviewed: 12/25/2015 Elsevier Interactive Patient Education  [employer]2018 Elsevier Inc.

## 2018-01-16 ENCOUNTER — Encounter: Payer: Self-pay | Admitting: Family Medicine

## 2018-02-17 ENCOUNTER — Other Ambulatory Visit: Payer: Self-pay | Admitting: Internal Medicine

## 2018-04-18 ENCOUNTER — Ambulatory Visit (INDEPENDENT_AMBULATORY_CARE_PROVIDER_SITE_OTHER): Payer: Federal, State, Local not specified - PPO | Admitting: Internal Medicine

## 2018-04-18 ENCOUNTER — Encounter: Payer: Self-pay | Admitting: Internal Medicine

## 2018-04-18 VITALS — BP 140/88 | HR 82 | Temp 99.0°F | Wt 212.5 lb

## 2018-04-18 DIAGNOSIS — J22 Unspecified acute lower respiratory infection: Secondary | ICD-10-CM | POA: Diagnosis not present

## 2018-04-18 DIAGNOSIS — J351 Hypertrophy of tonsils: Secondary | ICD-10-CM | POA: Diagnosis not present

## 2018-04-18 DIAGNOSIS — J45901 Unspecified asthma with (acute) exacerbation: Secondary | ICD-10-CM | POA: Diagnosis not present

## 2018-04-18 MED ORDER — BUDESONIDE-FORMOTEROL FUMARATE 160-4.5 MCG/ACT IN AERO: 2.0000 | INHALATION_SPRAY | Freq: Two times a day (BID) | RESPIRATORY_TRACT | 3 refills | Status: DC

## 2018-04-18 MED ORDER — PREDNISONE 20 MG PO TABS: ORAL_TABLET | ORAL | 0 refills | Status: DC

## 2018-04-18 NOTE — Progress Notes (Signed)
Chief Complaint  Patient presents with  . Asthma    Asthma flare x 3-4 days. Pt states he is coughing with yellow mucous and has a runny nose. Pt states that when it started he was having wheezing, chest tightness, diarrhea and decreased appetite. Pt has low grade temp,    HPI: Randall Good 22 y.o. come in for sda   Hx asthma when younger and now onset like above.  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  Sat  Cough and sneezy   ? Fever  At onset  .  And feeling very achy and ill went to work on Monday had diarrhea urgency and had to stop and go home.  Tried inhaler as needed some times.  Every  Day  2 3 day.  Inhaler helps some and no increasing shortness of breath no hemoptysis comes in today for evaluation. Tonsils he feels are better he does snore more recently in his life but no obstructive apnea. Admits to occasionally using inhaled tobacco when he is with his friends but not on a regular basis. ROS: See pertinent positives and negatives per HPI.  Past Medical History:  Diagnosis Date  . ADD (attention deficit disorder)    possible  no eval in record  . Allergic rhinitis     dr Barnetta Chapelwhelan in the past  . Asthma   . Eczema     Family History  Problem Relation Age of Onset  . Bipolar disorder Father   . Asthma Father   . Asthma Brother     Social History   Socioeconomic History  . Marital status: Single    Spouse name: Not on file  . Number of children: Not on file  . Years of education: Not on file  . Highest education level: Not on file  Occupational History  . Not on file  Social Needs  . Financial resource strain: Not on file  . Food insecurity:    Worry: Not on file    Inability: Not on file  . Transportation needs:    Medical: Not on file    Non-medical: Not on file  Tobacco Use  . Smoking status: Never Smoker  . Smokeless tobacco: Never Used  Substance and Sexual Activity  . Alcohol use: No    . Drug use: No  . Sexual activity: Yes    Birth control/protection: Condom  Lifestyle  . Physical activity:    Days per week: Not on file    Minutes per session: Not on file  . Stress: Not on file  Relationships  . Social connections:    Talks on phone: Not on file    Gets together: Not on file    Attends religious service: Not on file    Active member of club or organization: Not on file    Attends meetings of clubs or organizations: Not on file    Relationship status: Not on file  Other Topics Concern  . Not on file  Social History Narrative   Parents divorced  Father [place]   HH of 2   Now working  [employer]   No pets   Football   12h grade [employer]    Outpatient Medications Prior to Visit  Medication Sig Dispense Refill  . albuterol (PROVENTIL HFA) 108 (90 Base) MCG/ACT inhaler INHALE 2 PUFFS BY MOUTH INTO THE LUNGS EVERY 6 HOURS AS NEEDED FOR WHEEZING 6.7 g 0  . Doxylamine-DM (VICKS DAYQUIL/NYQUIL  COUGH PO) Take by mouth as needed.    Marland Kitchen PROVENTIL HFA 108 (90 Base) MCG/ACT inhaler INHALE 2 PUFFS INTO THE LUNGS EVERY 6 HOURS AS NEEDED FOR WHEEZING OR SHORTNESS OF BREATH 6.7 g 0   No facility-administered medications prior to visit.      EXAM:  BP 140/88 (BP Location: Right Arm, Patient Position: Sitting, Cuff Size: Normal)   Pulse 82   Temp 99 F (37.2 C) (Oral)   Wt 212 lb 8 oz (96.4 kg)   SpO2 97%   BMI 28.82 kg/m   Body mass index is 28.82 kg/m.  GENERAL: vitals reviewed and listed above, alert, oriented, appears well hydrated and in no acute distress obvious upper respiratory congestion occasional wheezy cough nontoxic no respiratory distress. HEENT: atraumatic, conjunctiva  clear, no obvious abnormalities on inspection of external nose and ears TMs are clear OP : no lesion edema or exudate tonsils are still were large +2 left more than right but no exudate or edema. NECK: no obvious masses on inspection palpation  LUNGS: Occasional to rare expiratory wheeze  but good breath sounds equally and air movement without retraction and no rales. CV: HRRR, no clubbing cyanosis or  peripheral edema nl cap refill  MS: moves all extremities without noticeable focal  abnormality PSYCH: pleasant and cooperative, no obvious depression or anxiety  BP Readings from Last 3 Encounters:  04/18/18 140/88  01/15/18 (!) 150/98  12/11/17 120/70   Wt Readings from Last 3 Encounters:  04/18/18 212 lb 8 oz (96.4 kg)  01/15/18 211 lb (95.7 kg)  12/11/17 203 lb 1.6 oz (92.1 kg)     ASSESSMENT AND PLAN:  Discussed the following assessment and plan:  Acute respiratory infection  Asthma with acute exacerbation, unspecified asthma severity, unspecified whether persistent  Enlarged tonsils Last seen asthma flaer 1 19 This acts like viral respiratory infection flaring up mild wheezing at this time.  Rescue inhaler as needed Symbicort through the illness and then future illnesses prescription printed for prednisone in case needed. Come back next week for flu vaccine get appointment for CPX in 1 to 2 months.  Note to be written for work. bp is up  Will come back for cps October  Or thereabouts.  Made patient aware that it is elevated for age and can he can recheck it in the meantime and we will follow-up when he comes back. -Patient advised to return or notify health care team  if  new concerns arise.  Patient Instructions  This is most likely a viral respiratory infection  With wheezing. . Usually resolve on its own but sometimes can flare  asthma .   If wheezing not improving then  take the prednisone.   And can use controller inhaler symbicort  Until better .    Every day for  2-4 weeks      And  Also in future when getting a   Respiratory  infection and cough   Plan flu vaccine next Friday or soon.    Do not use tobacco in your lungs.....  Neta Mends. Panosh M.D.

## 2018-04-18 NOTE — Patient Instructions (Signed)
This is most likely a viral respiratory infection  With wheezing. . Usually resolve on its own but sometimes can flare  asthma .   If wheezing not improving then  take the prednisone.   And can use controller inhaler symbicort  Until better .    Every day for  2-4 weeks      And  Also in future when getting a   Respiratory  infection and cough   Plan flu vaccine next Friday or soon.    Do not use tobacco in your lungs...Marland Kitchen..Marland Kitchen

## 2018-05-04 ENCOUNTER — Ambulatory Visit: Payer: Federal, State, Local not specified - PPO

## 2018-05-07 ENCOUNTER — Other Ambulatory Visit: Payer: Self-pay | Admitting: Internal Medicine

## 2018-07-18 ENCOUNTER — Ambulatory Visit: Payer: Federal, State, Local not specified - PPO | Admitting: Family Medicine

## 2019-02-19 ENCOUNTER — Telehealth: Payer: Self-pay | Admitting: Internal Medicine

## 2019-02-19 NOTE — Telephone Encounter (Signed)
albuterol (PROVENTIL HFA;VENTOLIN HFA) 108 (90 Base) MCG/ACT   [facility] [place], [address] AT [address] [phone] (Phone) [phone] (Fax)

## 2019-02-20 MED ORDER — ALBUTEROL SULFATE HFA 108 (90 BASE) MCG/ACT IN AERS
INHALATION_SPRAY | RESPIRATORY_TRACT | 0 refills | Status: DC
Start: 2019-02-20 — End: 2019-05-24

## 2019-02-20 NOTE — Telephone Encounter (Signed)
Prescription has been sent in  

## 2019-03-16 ENCOUNTER — Other Ambulatory Visit: Payer: Self-pay | Admitting: Internal Medicine

## 2019-05-24 ENCOUNTER — Other Ambulatory Visit: Payer: Self-pay | Admitting: Internal Medicine

## 2019-05-24 NOTE — Telephone Encounter (Signed)
Medication Refill - Medication: albuterol (PROVENTIL HFA) 108 (90 Base) MCG/ACT inhaler     Preferred Pharmacy (with phone number or street name):  [facility] [place], [place] [address]  [place] [address]  Phone: [phone] Fax: [phone]     Agent: Please be advised that RX refills may take up to 3 business days. We ask that you follow-up with your pharmacy.

## 2019-05-24 NOTE — Telephone Encounter (Signed)
Requested medication (s) are due for refill today: yes  Requested medication (s) are on the active medication list: yes  Last refill:  02/20/2019  Future visit scheduled: no  Notes to clinic:  Overdue for office visit Review for refill   Requested Prescriptions  Pending Prescriptions Disp Refills   albuterol (VENTOLIN HFA) 108 (90 Base) MCG/ACT inhaler [Pharmacy Med Name: ALBUTEROL HFA INH (200 PUFFS)6.7GM] 6.7 g 0    Sig: INHALE 2 PUFFS INTO THE LUNGS EVERY 6 HOURS AS NEEDED FOR WHEEZING OR SHORTNESS OF BREATH     Pulmonology:  Beta Agonists Failed - 05/24/2019 10:50 AM      Failed - One inhaler should last at least one month. If the patient is requesting refills earlier, contact the patient to check for uncontrolled symptoms.      Failed - Valid encounter within last 12 months    Recent Outpatient Visits          1 year ago Acute respiratory infection   [facility], Standley Brooking, MD   1 year ago Enlarged tonsils   [place] [facility], Standley Brooking, MD   1 year ago Strep pharyngitis   [place] [facility], Alinda Sierras, MD   1 year ago Asthma with acute exacerbation, unspecified asthma severity, unspecified whether persistent   [facility], Standley Brooking, MD   5 years ago Rash of unknown cause   [facility] at [facility], Standley Brooking, MD

## 2019-06-17 ENCOUNTER — Other Ambulatory Visit: Payer: Self-pay | Admitting: Internal Medicine

## 2019-06-17 NOTE — Telephone Encounter (Signed)
Pt scheduled for virtual.  ?

## 2019-06-18 ENCOUNTER — Telehealth: Payer: Federal, State, Local not specified - PPO | Admitting: Internal Medicine

## 2019-06-18 NOTE — Progress Notes (Deleted)
Virtual Visit via Video Note  I connected with@ on 06/18/19 at  2:30 PM EST by a video enabled telemedicine application and verified that I am speaking with the correct person using two identifiers. Location patient: home Location provider:work or home office Persons participating in the virtual visit: patient, provider  [place] national recommendations  regarding COVID 19 pandemic   video visit is advised over in office visit for this patient.  Patient aware  of the limitations of evaluation and management by telemedicine and  availability of in person appointments. and agreed to proceed.   HPI: Randall Good presents for video visit  Last visit was  9 19  Has hx of asthma   Erratic follow up   Hx of  Tonsillar infections  Last uyear  Had dot physical last year at [facility]   ROS: See pertinent positives and negatives per HPI.  Past Medical History:  Diagnosis Date  . ADD (attention deficit disorder)    possible  no eval in record  . Allergic rhinitis     dr Orvil Feil in the past  . Asthma   . Eczema     No past surgical history on file.  Family History  Problem Relation Age of Onset  . Bipolar disorder Father   . Asthma Father   . Asthma Brother     Social History   Tobacco Use  . Smoking status: Never Smoker  . Smokeless tobacco: Never Used  Substance Use Topics  . Alcohol use: No  . Drug use: No      Current Outpatient Medications:  .  albuterol (PROVENTIL HFA) 108 (90 Base) MCG/ACT inhaler, INHALE 2 PUFFS BY MOUTH INTO THE LUNGS EVERY 6 HOURS AS NEEDED FOR WHEEZING, Disp: 6.7 g, Rfl: 0 .  albuterol (VENTOLIN HFA) 108 (90 Base) MCG/ACT inhaler, INHALE 2 PUFFS INTO THE LUNGS EVERY 6 HOURS AS NEEDED FOR WHEEZING OR SHORTNESS OF BREATH, Disp: 6.7 g, Rfl: 0 .  budesonide-formoterol (SYMBICORT) 160-4.5 MCG/ACT inhaler, Inhale 2 puffs into the lungs 2 (two) times daily. As  Directed, Disp: 1 Inhaler, Rfl: 3 .  Doxylamine-DM (VICKS DAYQUIL/NYQUIL COUGH PO), Take by  mouth as needed., Disp: , Rfl:  .  predniSONE (DELTASONE) 20 MG tablet, Take 3 po qd for 2 days then 2 po qd for 3 days,or as directed for asthma flare, Disp: 12 tablet, Rfl: 0  EXAM: BP Readings from Last 3 Encounters:  04/18/18 140/88  01/15/18 (!) 150/98  12/11/17 120/70    VITALS per patient if applicable:  GENERAL: alert, oriented, appears well and in no acute distress  HEENT: atraumatic, conjunttiva clear, no obvious abnormalities on inspection of external nose and ears  NECK: normal movements of the head and neck  LUNGS: on inspection no signs of respiratory distress, breathing rate appears normal, no obvious gross SOB, gasping or wheezing  CV: no obvious cyanosis  MS: moves all visible extremities without noticeable abnormality  PSYCH/NEURO: pleasant and cooperative, no obvious depression or anxiety, speech and thought processing grossly intact Lab Results  Component Value Date   WBC 7.3 09/29/2009   HGB 12.8 (L) 09/29/2009   HCT 39.3 09/29/2009   PLT 345.0 09/29/2009   GLUCOSE 92 09/29/2009   CHOL 152 09/29/2009   TRIG 107.0 09/29/2009   HDL 51.20 09/29/2009   LDLCALC 79 09/29/2009   ALT 20 09/29/2009   AST 24 09/29/2009   NA 138 09/29/2009   K 3.9 09/29/2009   CL 102 09/29/2009   CREATININE  0.7 09/29/2009   BUN 13 09/29/2009   CO2 27 09/29/2009   TSH 2.59 09/29/2009   HGBA1C 5.8 09/29/2009    ASSESSMENT AND PLAN:  Discussed the following assessment and plan:  No diagnosis found.  Counseled.   Expectant management and discussion of plan and treatment with opportunity to ask questions and all were answered. The patient agreed with the plan and demonstrated an understanding of the instructions.   Advised to call back or seek an in-person evaluation if worsening  or having  further concerns .  I provided *** minutes of non-face-to-face time during this encounter.   Berniece Andreas, MD

## 2019-06-20 NOTE — Telephone Encounter (Signed)
Spoke wit pt he is going to call back to schedule appt

## 2019-07-27 ENCOUNTER — Other Ambulatory Visit: Payer: Self-pay | Admitting: Internal Medicine

## 2019-07-29 ENCOUNTER — Telehealth: Payer: Self-pay

## 2019-07-29 NOTE — Telephone Encounter (Signed)
Copied from [place] [id number]. Topic: Appointment Scheduling - Scheduling Inquiry for Clinic >> Jul 29, 2019  1:00 PM [name] A wrote: Reason for CRM: pt called in requesting a virtual appt for med refill with Dr Regis Bill.  Office was on other calls /  Please advise  Best number  [phone]

## 2019-07-29 NOTE — Telephone Encounter (Signed)
Pt has been scheduled.  °

## 2019-07-29 NOTE — Telephone Encounter (Signed)
Called pt to schedule no answer 

## 2019-07-30 ENCOUNTER — Telehealth: Payer: Federal, State, Local not specified - PPO | Admitting: Internal Medicine

## 2019-07-30 ENCOUNTER — Other Ambulatory Visit: Payer: Self-pay

## 2019-07-30 NOTE — Progress Notes (Deleted)
Virtual Visit via Video Note  I connected with@ on 07/30/19 at  1:30 PM EST by a video enabled telemedicine application and verified that I am speaking with the correct person using two identifiers. Location patient: home Location provider: home office Persons participating in the virtual visit: patient, provider  [place] national recommendations  regarding COVID 19 pandemic   video visit is advised over in office visit for this patient.  Patient aware  of the limitations of evaluation and management by telemedicine and  availability of in person appointments. and agreed to proceed.   HPI: Randall Good presents for video visit    ROS: See pertinent positives and negatives per HPI.  Past Medical History:  Diagnosis Date  . ADD (attention deficit disorder)    possible  no eval in record  . Allergic rhinitis     dr Orvil Feil in the past  . Asthma   . Eczema     No past surgical history on file.  Family History  Problem Relation Age of Onset  . Bipolar disorder Father   . Asthma Father   . Asthma Brother     Social History   Tobacco Use  . Smoking status: Never Smoker  . Smokeless tobacco: Never Used  Substance Use Topics  . Alcohol use: No  . Drug use: No      Current Outpatient Medications:  .  albuterol (PROVENTIL HFA) 108 (90 Base) MCG/ACT inhaler, INHALE 2 PUFFS BY MOUTH INTO THE LUNGS EVERY 6 HOURS AS NEEDED FOR WHEEZING, Disp: 6.7 g, Rfl: 0 .  albuterol (VENTOLIN HFA) 108 (90 Base) MCG/ACT inhaler, INHALE 2 PUFFS INTO THE LUNGS EVERY 6 HOURS AS NEEDED FOR WHEEZING OR SHORTNESS OF BREATH, Disp: 6.7 g, Rfl: 0 .  budesonide-formoterol (SYMBICORT) 160-4.5 MCG/ACT inhaler, Inhale 2 puffs into the lungs 2 (two) times daily. As  Directed, Disp: 1 Inhaler, Rfl: 3 .  Doxylamine-DM (VICKS DAYQUIL/NYQUIL COUGH PO), Take by mouth as needed., Disp: , Rfl:  .  predniSONE (DELTASONE) 20 MG tablet, Take 3 po qd for 2 days then 2 po qd for 3 days,or as directed for asthma  flare, Disp: 12 tablet, Rfl: 0  EXAM: BP Readings from Last 3 Encounters:  04/18/18 140/88  01/15/18 (!) 150/98  12/11/17 120/70    VITALS per patient if applicable:  GENERAL: alert, oriented, appears well and in no acute distress  HEENT: atraumatic, conjunttiva clear, no obvious abnormalities on inspection of external nose and ears  NECK: normal movements of the head and neck  LUNGS: on inspection no signs of respiratory distress, breathing rate appears normal, no obvious gross SOB, gasping or wheezing  CV: no obvious cyanosis  MS: moves all visible extremities without noticeable abnormality  PSYCH/NEURO: pleasant and cooperative, no obvious depression or anxiety, speech and thought processing grossly intact Lab Results  Component Value Date   WBC 7.3 09/29/2009   HGB 12.8 (L) 09/29/2009   HCT 39.3 09/29/2009   PLT 345.0 09/29/2009   GLUCOSE 92 09/29/2009   CHOL 152 09/29/2009   TRIG 107.0 09/29/2009   HDL 51.20 09/29/2009   LDLCALC 79 09/29/2009   ALT 20 09/29/2009   AST 24 09/29/2009   NA 138 09/29/2009   K 3.9 09/29/2009   CL 102 09/29/2009   CREATININE 0.7 09/29/2009   BUN 13 09/29/2009   CO2 27 09/29/2009   TSH 2.59 09/29/2009   HGBA1C 5.8 09/29/2009    ASSESSMENT AND PLAN:  Discussed the following assessment and plan:  No diagnosis found.  Counseled.   Expectant management and discussion of plan and treatment with opportunity to ask questions and all were answered. The patient agreed with the plan and demonstrated an understanding of the instructions.   Advised to call back or seek an in-person evaluation if worsening  or having  further concerns . No follow-ups on file. I provided *** minutes of non-face-to-face time during this encounter.   Berniece Andreas, MD

## 2019-07-31 ENCOUNTER — Encounter: Payer: Self-pay | Admitting: Internal Medicine

## 2019-07-31 ENCOUNTER — Telehealth (INDEPENDENT_AMBULATORY_CARE_PROVIDER_SITE_OTHER): Payer: Federal, State, Local not specified - PPO | Admitting: Internal Medicine

## 2019-07-31 ENCOUNTER — Other Ambulatory Visit: Payer: Self-pay

## 2019-07-31 DIAGNOSIS — J4520 Mild intermittent asthma, uncomplicated: Secondary | ICD-10-CM

## 2019-07-31 DIAGNOSIS — Z79899 Other long term (current) drug therapy: Secondary | ICD-10-CM | POA: Diagnosis not present

## 2019-07-31 MED ORDER — ALBUTEROL SULFATE HFA 108 (90 BASE) MCG/ACT IN AERS: INHALATION_SPRAY | RESPIRATORY_TRACT | 2 refills | Status: DC

## 2019-07-31 MED ORDER — BUDESONIDE-FORMOTEROL FUMARATE 160-4.5 MCG/ACT IN AERO: 2.0000 | INHALATION_SPRAY | Freq: Two times a day (BID) | RESPIRATORY_TRACT | 5 refills | Status: DC

## 2019-07-31 NOTE — Progress Notes (Signed)
Virtual Visit via Video Note  I connected with@ on 07/31/19 at  2:30 PM EST by a video enabled telemedicine application and verified that I am speaking with the correct person using two identifiers. Location patient: home Location provider home office Persons participating in the virtual visit: patient, provider  [place] national recommendations  regarding COVID 19 pandemic   video visit is advised over in office visit for this patient.  Patient aware  of the limitations of evaluation and management by telemedicine and  availability of in person appointments. and agreed to proceed.   HPI: Randall Good presents for video visit for follow-up of his asthma. He requests a refill for his albuterol. 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 He has gotten hoarse and goes out visits and when he notices feeding it hay he will wheezing he have to use his albuterol.  Wearing a mask mitigates this. He is trying to get healthy and eventually will go to the gym and wants to have a rescue inhaler in case. He has been on no recent controller inhaler as has not needed it otherwise.  Blood pressure checks at work are apparently normal.  No other unusual health concerns.  No tobacco.    ROS: See pertinent positives and negatives per HPI.  Past Medical History:  Diagnosis Date  . ADD (attention deficit disorder)    possible  no eval in record  . Allergic rhinitis     dr Barnetta Chapel in the past  . Asthma   . Eczema     History reviewed. No pertinent surgical history.  Family History  Problem Relation Age of Onset  . Bipolar disorder Father   . Asthma Father   . Asthma Brother         Current Outpatient Medications:  .  albuterol (PROVENTIL HFA) 108 (90 Base) MCG/ACT inhaler, INHALE 2 PUFFS BY MOUTH INTO THE LUNGS EVERY 6 HOURS AS NEEDED FOR WHEEZING, Disp: 6.7 g, Rfl: 0 .  albuterol (VENTOLIN HFA) 108 (90 Base) MCG/ACT inhaler, INHALE 2  PUFFS INTO THE LUNGS EVERY 6 HOURS AS NEEDED FOR WHEEZING OR SHORTNESS OF BREATH, Disp: 6.7 g, Rfl: 2 .  budesonide-formoterol (SYMBICORT) 160-4.5 MCG/ACT inhaler, Inhale 2 puffs into the lungs 2 (two) times daily. For asthma control, Disp: 1 Inhaler, Rfl: 5 .  Doxylamine-DM (VICKS DAYQUIL/NYQUIL COUGH PO), Take by mouth as needed., Disp: , Rfl:  .  predniSONE (DELTASONE) 20 MG tablet, Take 3 po qd for 2 days then 2 po qd for 3 days,or as directed for asthma flare, Disp: 12 tablet, Rfl: 0  EXAM: BP Readings from Last 3 Encounters:  04/18/18 140/88  01/15/18 (!) 150/98  12/11/17 120/70    VITALS per patient if applicable: He looks well in no acute distress.  GENERAL: alert, oriented, appears well and in no acute distress  HEENT: atraumatic, conjunttiva clear, no obvious abnormalities on inspection of external nose and ears  NECK: normal movements of the head and neck  LUNGS: on inspection no signs of respiratory distress, breathing rate appears normal, no obvious gross SOB, gasping or wheezing  CV: no obvious cyanosis  MS: moves all visible extremities without noticeable abnormality  PSYCH/NEURO: pleasant and cooperative, no obvious depression or anxiety, speech and thought processing grossly intact   ASSESSMENT AND PLAN:  Discussed the following assessment and plan:    ICD-10-CM   1. Intermittent asthma without complication, unspecified asthma severity  J45.20  2. Medication management  Z79.899     Counseled.  That he probably should use controller inhaler Symbicort for now because of exposure to insight hurts.  Sent in let us know if insurance is an issue to use daily for suppression since he is exposed.  Continue mask wearing when  feed his horse hayand can use albuterol as rescue. Plan our OV in person or virtual in about 6 months. At some point may want to get blood work make sure his blood pressure is okay and healthy lifestyle. He is aware and trying to avoid Covid  risk because of his past history of asthma although recently has been fairly stable.  His job hours go from days to nights a time and he can call a day ahead of has to cancel it was change his appointment.  Expectant management and discussion of plan and treatment with opportunity to ask questions and all were answered. The patient agreed with the plan and demonstrated an understanding of the instructions.  Total visit 15mins > 50% spent counseling and coordinating care as indicated in above note and in instructions to patient .  Asthma management    Advised to call back or seek an in-person evaluation if worsening  or having  further concerns . Return in about 6 months (around 01/29/2020).  Shanon Ace, MD

## 2019-10-24 ENCOUNTER — Other Ambulatory Visit: Payer: Self-pay | Admitting: Internal Medicine

## 2020-01-25 ENCOUNTER — Other Ambulatory Visit: Payer: Self-pay | Admitting: Family Medicine

## 2020-01-27 NOTE — Telephone Encounter (Signed)
Routed to pcp cma  

## 2020-02-07 NOTE — Progress Notes (Deleted)
No chief complaint on file.   HPI: Randall Good 24 y.o. come in for Chronic disease management  ROS: See pertinent positives and negatives per HPI.  Past Medical History:  Diagnosis Date  . ADD (attention deficit disorder)    possible  no eval in record  . Allergic rhinitis     dr Barnetta Chapel in the past  . Asthma   . Eczema     Family History  Problem Relation Age of Onset  . Bipolar disorder Father   . Asthma Father   . Asthma Brother     Social History   Socioeconomic History  . Marital status: Single    Spouse name: Not on file  . Number of children: Not on file  . Years of education: Not on file  . Highest education level: Not on file  Occupational History  . Not on file  Tobacco Use  . Smoking status: Never Smoker  . Smokeless tobacco: Never Used  Substance and Sexual Activity  . Alcohol use: No  . Drug use: No  . Sexual activity: Yes    Birth control/protection: Condom  Other Topics Concern  . Not on file  Social History Narrative   Parents divorced  Father [place]   HH of 2   Now working  [employer]   No pets   Football   12h grade [place]   Social Determinants of Health   Financial Resource Strain:   . Difficulty of Paying Living Expenses:   Food Insecurity:   . Worried About Programme researcher, broadcasting/film/video in the Last Year:   . Barista in the Last Year:   Transportation Needs:   . Freight forwarder (Medical):   Marland Kitchen Lack of Transportation (Non-Medical):   Physical Activity:   . Days of Exercise per Week:   . Minutes of Exercise per Session:   Stress:   . Feeling of Stress :   Social Connections:   . Frequency of Communication with Friends and Family:   . Frequency of Social Gatherings with Friends and Family:   . Attends Religious Services:   . Active Member of Clubs or Organizations:   . Attends Banker Meetings:   Marland Kitchen Marital Status:     Outpatient Medications Prior to Visit  Medication Sig Dispense Refill  . albuterol  (PROVENTIL HFA) 108 (90 Base) MCG/ACT inhaler INHALE 2 PUFFS BY MOUTH INTO THE LUNGS EVERY 6 HOURS AS NEEDED FOR WHEEZING 6.7 g 0  . albuterol (VENTOLIN HFA) 108 (90 Base) MCG/ACT inhaler INHALE 2 PUFFS INTO THE LUNGS EVERY 6 HOURS AS NEEDED FOR WHEEZING OR SHORTNESS OF BREATH--Please schedule a follow up for refills. [phone] 6.7 g 0  . budesonide-formoterol (SYMBICORT) 160-4.5 MCG/ACT inhaler Inhale 2 puffs into the lungs 2 (two) times daily. For asthma control 1 Inhaler 5  . Doxylamine-DM (VICKS DAYQUIL/NYQUIL COUGH PO) Take by mouth as needed.    . predniSONE (DELTASONE) 20 MG tablet Take 3 po qd for 2 days then 2 po qd for 3 days,or as directed for asthma flare 12 tablet 0   No facility-administered medications prior to visit.     EXAM:  There were no vitals taken for this visit.  There is no height or weight on file to calculate BMI.  GENERAL: vitals reviewed and listed above, alert, oriented, appears well hydrated and in no acute distress HEENT: atraumatic, conjunctiva  clear, no obvious abnormalities on inspection of external nose and ears OP :  no lesion edema or exudate  NECK: no obvious masses on inspection palpation  LUNGS: clear to auscultation bilaterally, no wheezes, rales or rhonchi, good air movement CV: HRRR, no clubbing cyanosis or  peripheral edema nl cap refill  MS: moves all extremities without noticeable focal  abnormality PSYCH: pleasant and cooperative, no obvious depression or anxiety Lab Results  Component Value Date   WBC 7.3 09/29/2009   HGB 12.8 (L) 09/29/2009   HCT 39.3 09/29/2009   PLT 345.0 09/29/2009   GLUCOSE 92 09/29/2009   CHOL 152 09/29/2009   TRIG 107.0 09/29/2009   HDL 51.20 09/29/2009   LDLCALC 79 09/29/2009   ALT 20 09/29/2009   AST 24 09/29/2009   NA 138 09/29/2009   K 3.9 09/29/2009   CL 102 09/29/2009   CREATININE 0.7 09/29/2009   BUN 13 09/29/2009   CO2 27 09/29/2009   TSH 2.59 09/29/2009   HGBA1C 5.8 09/29/2009   BP  Readings from Last 3 Encounters:  04/18/18 140/88  01/15/18 (!) 150/98  12/11/17 120/70    ASSESSMENT AND PLAN:  Discussed the following assessment and plan:  Medication management  Intermittent asthma without complication, unspecified asthma severity Over due should have labs   bp readings needed  -Patient advised to return or notify health care team  if  new concerns arise.  There are no Patient Instructions on file for this visit.   Neta Mends. Shaquanna Lycan M.D.

## 2020-02-10 ENCOUNTER — Ambulatory Visit: Payer: Federal, State, Local not specified - PPO | Admitting: Internal Medicine

## 2020-02-11 NOTE — Progress Notes (Deleted)
No chief complaint on file.   HPI: Randall Good 24 y.o. come in for  Fu meds and asthma managmetn   BP  Tonsils?  ROS: See pertinent positives and negatives per HPI.  Past Medical History:  Diagnosis Date  . ADD (attention deficit disorder)    possible  no eval in record  . Allergic rhinitis     dr Barnetta Chapel in the past  . Asthma   . Eczema     Family History  Problem Relation Age of Onset  . Bipolar disorder Father   . Asthma Father   . Asthma Brother     Social History   Socioeconomic History  . Marital status: Single    Spouse name: Not on file  . Number of children: Not on file  . Years of education: Not on file  . Highest education level: Not on file  Occupational History  . Not on file  Tobacco Use  . Smoking status: Never Smoker  . Smokeless tobacco: Never Used  Substance and Sexual Activity  . Alcohol use: No  . Drug use: No  . Sexual activity: Yes    Birth control/protection: Condom  Other Topics Concern  . Not on file  Social History Narrative   Parents divorced  Father [place]   HH of 2   Now working  [employer]   No pets   Football   12h grade [place]   Social Determinants of Health   Financial Resource Strain:   . Difficulty of Paying Living Expenses:   Food Insecurity:   . Worried About Programme researcher, broadcasting/film/video in the Last Year:   . Barista in the Last Year:   Transportation Needs:   . Freight forwarder (Medical):   Marland Kitchen Lack of Transportation (Non-Medical):   Physical Activity:   . Days of Exercise per Week:   . Minutes of Exercise per Session:   Stress:   . Feeling of Stress :   Social Connections:   . Frequency of Communication with Friends and Family:   . Frequency of Social Gatherings with Friends and Family:   . Attends Religious Services:   . Active Member of Clubs or Organizations:   . Attends Banker Meetings:   Marland Kitchen Marital Status:     Outpatient Medications Prior to Visit  Medication Sig Dispense  Refill  . albuterol (PROVENTIL HFA) 108 (90 Base) MCG/ACT inhaler INHALE 2 PUFFS BY MOUTH INTO THE LUNGS EVERY 6 HOURS AS NEEDED FOR WHEEZING 6.7 g 0  . albuterol (VENTOLIN HFA) 108 (90 Base) MCG/ACT inhaler INHALE 2 PUFFS INTO THE LUNGS EVERY 6 HOURS AS NEEDED FOR WHEEZING OR SHORTNESS OF BREATH--Please schedule a follow up for refills. [phone] 6.7 g 0  . budesonide-formoterol (SYMBICORT) 160-4.5 MCG/ACT inhaler Inhale 2 puffs into the lungs 2 (two) times daily. For asthma control 1 Inhaler 5  . Doxylamine-DM (VICKS DAYQUIL/NYQUIL COUGH PO) Take by mouth as needed.    . predniSONE (DELTASONE) 20 MG tablet Take 3 po qd for 2 days then 2 po qd for 3 days,or as directed for asthma flare 12 tablet 0   No facility-administered medications prior to visit.     EXAM:  There were no vitals taken for this visit.  There is no height or weight on file to calculate BMI. Wt Readings from Last 3 Encounters:  04/18/18 212 lb 8 oz (96.4 kg)  01/15/18 211 lb (95.7 kg)  12/11/17 203 lb 1.6  oz (92.1 kg)    GENERAL: vitals reviewed and listed above, alert, oriented, appears well hydrated and in no acute distress HEENT: atraumatic, conjunctiva  clear, no obvious abnormalities on inspection of external nose and ears OP : masked  NECK: no obvious masses on inspection palpation  LUNGS: clear to auscultation bilaterally, no wheezes, rales or rhonchi, good air movement CV: HRRR, no clubbing cyanosis or  peripheral edema nl cap refill  MS: moves all extremities without noticeable focal  abnormality PSYCH: pleasant and cooperative, no obvious depression or anxiety Lab Results  Component Value Date   WBC 7.3 09/29/2009   HGB 12.8 (L) 09/29/2009   HCT 39.3 09/29/2009   PLT 345.0 09/29/2009   GLUCOSE 92 09/29/2009   CHOL 152 09/29/2009   TRIG 107.0 09/29/2009   HDL 51.20 09/29/2009   LDLCALC 79 09/29/2009   ALT 20 09/29/2009   AST 24 09/29/2009   NA 138 09/29/2009   K 3.9 09/29/2009   CL 102  09/29/2009   CREATININE 0.7 09/29/2009   BUN 13 09/29/2009   CO2 27 09/29/2009   TSH 2.59 09/29/2009   HGBA1C 5.8 09/29/2009   BP Readings from Last 3 Encounters:  04/18/18 140/88  01/15/18 (!) 150/98  12/11/17 120/70    ASSESSMENT AND PLAN:  Discussed the following assessment and plan:  Medication management  Intermittent asthma without complication, unspecified asthma severity ? Need for fu and wellness updated lipids labs etc  -Patient advised to return or notify health care team  if  new concerns arise.  There are no Patient Instructions on file for this visit.   Neta Mends. Shakura Cowing M.D.

## 2020-02-12 ENCOUNTER — Ambulatory Visit: Payer: Federal, State, Local not specified - PPO | Admitting: Internal Medicine

## 2020-02-12 DIAGNOSIS — Z0289 Encounter for other administrative examinations: Secondary | ICD-10-CM

## 2020-02-24 ENCOUNTER — Other Ambulatory Visit: Payer: Self-pay | Admitting: Internal Medicine

## 2020-03-07 ENCOUNTER — Other Ambulatory Visit: Payer: Self-pay | Admitting: Internal Medicine

## 2020-03-10 ENCOUNTER — Telehealth: Payer: Self-pay | Admitting: Internal Medicine

## 2020-03-10 ENCOUNTER — Other Ambulatory Visit: Payer: Self-pay

## 2020-03-10 MED ORDER — ALBUTEROL SULFATE HFA 108 (90 BASE) MCG/ACT IN AERS
INHALATION_SPRAY | RESPIRATORY_TRACT | 0 refills | Status: DC
Start: 2020-03-10 — End: 2020-06-08
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
## 2020-03-10 NOTE — Telephone Encounter (Signed)
Called patient and scheduled him a virtual appointment in the morning for a telephone call for a med refill. Patient verbalized an understanding. Sending in a small refill for him also.

## 2020-03-11 ENCOUNTER — Encounter: Payer: Self-pay | Admitting: Internal Medicine

## 2020-03-11 ENCOUNTER — Telehealth (INDEPENDENT_AMBULATORY_CARE_PROVIDER_SITE_OTHER): Payer: Federal, State, Local not specified - PPO | Admitting: Internal Medicine

## 2020-03-11 ENCOUNTER — Other Ambulatory Visit: Payer: Self-pay

## 2020-03-11 VITALS — HR 75 | Ht 73.0 in | Wt 220.0 lb

## 2020-03-11 DIAGNOSIS — J4520 Mild intermittent asthma, uncomplicated: Secondary | ICD-10-CM | POA: Diagnosis not present

## 2020-03-11 DIAGNOSIS — Z79899 Other long term (current) drug therapy: Secondary | ICD-10-CM

## 2020-03-11 DIAGNOSIS — R030 Elevated blood-pressure reading, without diagnosis of hypertension: Secondary | ICD-10-CM

## 2020-03-11 MED ORDER — BUDESONIDE-FORMOTEROL FUMARATE 160-4.5 MCG/ACT IN AERO: 2.0000 | INHALATION_SPRAY | Freq: Two times a day (BID) | RESPIRATORY_TRACT | 4 refills | Status: DC
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
## 2020-06-05 ENCOUNTER — Other Ambulatory Visit: Payer: Self-pay | Admitting: Internal Medicine

## 2020-06-29 ENCOUNTER — Other Ambulatory Visit: Payer: Self-pay | Admitting: Internal Medicine

## 2020-07-20 ENCOUNTER — Other Ambulatory Visit: Payer: Self-pay | Admitting: Internal Medicine

## 2020-08-10 ENCOUNTER — Other Ambulatory Visit: Payer: Self-pay | Admitting: Internal Medicine

## 2020-08-11 ENCOUNTER — Other Ambulatory Visit: Payer: Self-pay

## 2020-08-11 MED ORDER — ALBUTEROL SULFATE HFA 108 (90 BASE) MCG/ACT IN AERS: INHALATION_SPRAY | RESPIRATORY_TRACT | 0 refills | Status: DC

## 2020-08-31 ENCOUNTER — Other Ambulatory Visit: Payer: Self-pay | Admitting: Internal Medicine

## 2020-11-16 ENCOUNTER — Other Ambulatory Visit: Payer: Self-pay | Admitting: Internal Medicine

## 2020-12-29 ENCOUNTER — Other Ambulatory Visit: Payer: Self-pay | Admitting: Internal Medicine

## 2021-02-11 ENCOUNTER — Other Ambulatory Visit: Payer: Self-pay | Admitting: Internal Medicine

## 2021-03-30 ENCOUNTER — Encounter: Payer: Federal, State, Local not specified - PPO | Admitting: Internal Medicine

## 2021-04-22 ENCOUNTER — Emergency Department (HOSPITAL_COMMUNITY): Payer: BC Managed Care – PPO

## 2021-04-22 ENCOUNTER — Encounter: Payer: Self-pay | Admitting: Internal Medicine

## 2021-04-22 ENCOUNTER — Emergency Department (HOSPITAL_COMMUNITY)
Admission: EM | Admit: 2021-04-22 | Discharge: 2021-04-22 | Disposition: A | Discharge status: Home / Self Care | Payer: BC Managed Care – PPO | Attending: Emergency Medicine | Admitting: Emergency Medicine

## 2021-04-22 ENCOUNTER — Encounter (HOSPITAL_COMMUNITY): Payer: Self-pay

## 2021-04-22 DIAGNOSIS — Z23 Encounter for immunization: Secondary | ICD-10-CM | POA: Insufficient documentation

## 2021-04-22 DIAGNOSIS — Z91010 Allergy to peanuts: Secondary | ICD-10-CM | POA: Insufficient documentation

## 2021-04-22 DIAGNOSIS — W3400XA Accidental discharge from unspecified firearms or gun, initial encounter: Secondary | ICD-10-CM | POA: Diagnosis not present

## 2021-04-22 DIAGNOSIS — J45909 Unspecified asthma, uncomplicated: Secondary | ICD-10-CM | POA: Diagnosis not present

## 2021-04-22 DIAGNOSIS — S81802A Unspecified open wound, left lower leg, initial encounter: Secondary | ICD-10-CM | POA: Diagnosis not present

## 2021-04-22 DIAGNOSIS — S8992XA Unspecified injury of left lower leg, initial encounter: Secondary | ICD-10-CM | POA: Diagnosis present

## 2021-04-22 DIAGNOSIS — S90852A Superficial foreign body, left foot, initial encounter: Secondary | ICD-10-CM | POA: Diagnosis not present

## 2021-04-22 LAB — CBC WITH DIFFERENTIAL/PLATELET
Abs Immature Granulocytes: 0.06 10*3/uL (ref 0.00–0.07)
Basophils Absolute: 0.2 10*3/uL — ABNORMAL HIGH (ref 0.0–0.1)
Basophils Relative: 1 %
Eosinophils Absolute: 0.9 10*3/uL — ABNORMAL HIGH (ref 0.0–0.5)
Eosinophils Relative: 7 %
HCT: 46.7 % (ref 39.0–52.0)
Hemoglobin: 14.8 g/dL (ref 13.0–17.0)
Immature Granulocytes: 1 %
Lymphocytes Relative: 36 %
Lymphs Abs: 4.5 10*3/uL — ABNORMAL HIGH (ref 0.7–4.0)
MCH: 27 pg (ref 26.0–34.0)
MCHC: 31.7 g/dL (ref 30.0–36.0)
MCV: 85.1 fL (ref 80.0–100.0)
Monocytes Absolute: 0.8 10*3/uL (ref 0.1–1.0)
Monocytes Relative: 7 %
Neutro Abs: 6 10*3/uL (ref 1.7–7.7)
Neutrophils Relative %: 48 %
Platelets: 304 10*3/uL (ref 150–400)
RBC: 5.49 MIL/uL (ref 4.22–5.81)
RDW: 14 % (ref 11.5–15.5)
WBC: 12.5 10*3/uL — ABNORMAL HIGH (ref 4.0–10.5)
nRBC: 0 % (ref 0.0–0.2)

## 2021-04-22 LAB — BASIC METABOLIC PANEL
Anion gap: 10 (ref 5–15)
BUN: 12 mg/dL (ref 6–20)
CO2: 23 mmol/L (ref 22–32)
Calcium: 8.9 mg/dL (ref 8.9–10.3)
Chloride: 103 mmol/L (ref 98–111)
Creatinine, Ser: 1.3 mg/dL — ABNORMAL HIGH (ref 0.61–1.24)
GFR, Estimated: >60 mL/min (ref >60)
Glucose, Bld: 190 mg/dL — ABNORMAL HIGH (ref 70–99)
Potassium: 3.7 mmol/L (ref 3.5–5.1)
Sodium: 136 mmol/L (ref 135–145)

## 2021-04-22 MED ORDER — IOHEXOL 350 MG/ML SOLN
100.0000 mL | Freq: Once | INTRAVENOUS | Status: AC | PRN
  Administered 2021-04-22: 100 mL via INTRAVENOUS

## 2021-04-22 MED ORDER — HYDROCODONE-ACETAMINOPHEN 5-325 MG PO TABS: 1.0000 | ORAL_TABLET | ORAL | 0 refills | Status: DC | PRN

## 2021-04-22 MED ORDER — DOXYCYCLINE HYCLATE 100 MG PO CAPS: 100.0000 mg | ORAL_CAPSULE | Freq: Two times a day (BID) | ORAL | 0 refills | Status: DC

## 2021-04-22 MED ORDER — TETANUS-DIPHTH-ACELL PERTUSSIS 5-2.5-18.5 LF-MCG/0.5 IM SUSY
0.5000 mL | PREFILLED_SYRINGE | Freq: Once | INTRAMUSCULAR | Status: AC
  Administered 2021-04-22: 0.5 mL via INTRAMUSCULAR
  Filled 2021-04-22: qty 0.5

## 2021-04-22 MED ORDER — HYDROMORPHONE HCL 1 MG/ML IJ SOLN
1.0000 mg | Freq: Once | INTRAMUSCULAR | Status: AC
Start: 2021-04-22 — End: 2021-04-22
  Administered 2021-04-22: 1 mg via INTRAVENOUS
  Filled 2021-04-22: qty 1

## 2021-04-22 MED ORDER — HYDROMORPHONE HCL 1 MG/ML IJ SOLN
2.0000 mg | Freq: Once | INTRAMUSCULAR | Status: AC
Start: 2021-04-22 — End: 2021-04-22
  Administered 2021-04-22: 2 mg via INTRAVENOUS
  Filled 2021-04-22: qty 2

## 2021-04-22 MED ORDER — LIDOCAINE-EPINEPHRINE (PF) 2 %-1:200000 IJ SOLN
20.0000 mL | Freq: Once | INTRAMUSCULAR | Status: AC
  Administered 2021-04-22: 20 mL
  Filled 2021-04-22: qty 20

## 2021-04-22 MED ORDER — OXYCODONE-ACETAMINOPHEN 5-325 MG PO TABS
1.0000 | ORAL_TABLET | Freq: Once | ORAL | Status: AC
  Administered 2021-04-22: 1 via ORAL
  Filled 2021-04-22: qty 1

## 2021-04-22 MED ORDER — CEFAZOLIN SODIUM-DEXTROSE 1-4 GM/50ML-% IV SOLN
1.0000 g | Freq: Once | INTRAVENOUS | Status: AC
  Administered 2021-04-22: 1 g via INTRAVENOUS
  Filled 2021-04-22: qty 50

## 2021-04-22 MED ORDER — ONDANSETRON HCL 4 MG/2ML IJ SOLN
4.0000 mg | Freq: Once | INTRAMUSCULAR | Status: AC
  Administered 2021-04-22: 4 mg via INTRAVENOUS
  Filled 2021-04-22: qty 2
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
## 2021-04-22 NOTE — Discharge Instructions (Addendum)
If you develop redness, fever, increased swelling with drainage, return to the ER. If you get  significant swelling of the leg causing numbness, tingling, pain in the foot or color change of the foot, come to the ER immediately.
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
## 2021-04-23 ENCOUNTER — Telehealth: Payer: Self-pay | Admitting: Internal Medicine
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
## 2021-04-25 NOTE — Telephone Encounter (Signed)
Noted  Hope doing well.

## 2021-04-27 ENCOUNTER — Other Ambulatory Visit: Payer: Self-pay

## 2021-04-27 ENCOUNTER — Ambulatory Visit: Payer: Self-pay | Admitting: Family Medicine

## 2021-04-27 ENCOUNTER — Encounter: Payer: Self-pay | Admitting: Family Medicine

## 2021-04-27 ENCOUNTER — Ambulatory Visit (INDEPENDENT_AMBULATORY_CARE_PROVIDER_SITE_OTHER): Payer: BC Managed Care – PPO | Admitting: Family Medicine

## 2021-04-27 VITALS — BP 130/96 | HR 98 | Temp 98.2°F

## 2021-04-27 DIAGNOSIS — L03116 Cellulitis of left lower limb: Secondary | ICD-10-CM | POA: Diagnosis not present

## 2021-04-27 DIAGNOSIS — S81802A Unspecified open wound, left lower leg, initial encounter: Secondary | ICD-10-CM

## 2021-04-27 DIAGNOSIS — W3400XA Accidental discharge from unspecified firearms or gun, initial encounter: Secondary | ICD-10-CM

## 2021-04-27 MED ORDER — AMOXICILLIN-POT CLAVULANATE 875-125 MG PO TABS: 1.0000 | ORAL_TABLET | Freq: Two times a day (BID) | ORAL | 0 refills | Status: DC

## 2021-04-27 MED ORDER — OXYCODONE-ACETAMINOPHEN 10-325 MG PO TABS: 1.0000 | ORAL_TABLET | Freq: Four times a day (QID) | ORAL | 0 refills | Status: DC | PRN

## 2021-04-27 MED ORDER — CEFTRIAXONE SODIUM 1 G IJ SOLR
1.0000 g | Freq: Once | INTRAMUSCULAR | Status: AC
  Administered 2021-04-27: 1 g via INTRAMUSCULAR

## 2021-04-27 NOTE — Addendum Note (Signed)
Addended by: Carola Rhine on: 04/27/2021 04:52 PM   Modules accepted: Orders
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
## 2021-04-28 ENCOUNTER — Telehealth: Payer: Self-pay

## 2021-04-28 MED ORDER — HYDROCODONE-ACETAMINOPHEN 10-325 MG PO TABS: 1.0000 | ORAL_TABLET | ORAL | 0 refills | Status: AC | PRN

## 2021-04-28 NOTE — Telephone Encounter (Signed)
Spoke with pt aware that Dr Clent Ridges has sent Norco to his pharmacy since hiss insurance denied covering his Percocet Rx, pt is also aware to pick up his Short term disability forms from the office. Pt form placed in the front office cabinet, copy sent to scanning

## 2021-04-28 NOTE — Telephone Encounter (Signed)
His insurance company denied the Percocet, so I will send in more Norco 10-325. He can add 800 mg of Ibuprofen to that as needed. His disability paperwork is ready.

## 2021-04-28 NOTE — Telephone Encounter (Signed)
Pt was seen at the office yesterday by Dr Clent Ridges, This is Dr Fabian Sharp pt Please advise

## 2021-04-28 NOTE — Telephone Encounter (Signed)
Mother of patient called to check on the status of pt oxyCODONE-acetaminophen (PERCOCET) 10-325 MG tablet PA the mother stated pt is in a lot of pain and also wants a call back when paperwork is ready for pickup

## 2021-04-28 NOTE — Telephone Encounter (Signed)
Sent pt  MyChart message with Dr Clent Ridges recommendation, advised pt to call the office back with any question

## 2021-04-28 NOTE — Addendum Note (Signed)
Addended by: Gershon Crane A on: 04/28/2021 01:02 PM   Modules accepted: Orders

## 2021-04-30 ENCOUNTER — Ambulatory Visit: Payer: BC Managed Care – PPO | Admitting: Family Medicine

## 2021-05-04 ENCOUNTER — Ambulatory Visit: Payer: BC Managed Care – PPO | Admitting: Family Medicine

## 2021-05-06 ENCOUNTER — Other Ambulatory Visit: Payer: Self-pay

## 2021-05-06 ENCOUNTER — Encounter: Payer: Self-pay | Admitting: Family Medicine

## 2021-05-06 ENCOUNTER — Ambulatory Visit (INDEPENDENT_AMBULATORY_CARE_PROVIDER_SITE_OTHER): Payer: BC Managed Care – PPO | Admitting: Family Medicine

## 2021-05-06 VITALS — BP 128/92 | HR 78 | Temp 98.8°F | Wt 252.0 lb

## 2021-05-06 DIAGNOSIS — W3400XD Accidental discharge from unspecified firearms or gun, subsequent encounter: Secondary | ICD-10-CM | POA: Diagnosis not present

## 2021-05-06 DIAGNOSIS — L03116 Cellulitis of left lower limb: Secondary | ICD-10-CM | POA: Diagnosis not present
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
## 2021-05-19 ENCOUNTER — Telehealth: Payer: Self-pay | Admitting: Internal Medicine

## 2021-05-19 NOTE — Telephone Encounter (Signed)
Patient's mother Randall Good dropped off a form that she would like completed.  Randall Good would like a call at [phone] once form has been completed.  Form will be placed in red folder.  Please advise.

## 2021-05-19 NOTE — Telephone Encounter (Signed)
The form is ready  

## 2021-05-19 NOTE — Telephone Encounter (Signed)
I have not seen him what is the form for. I saw that Dr. Clent Ridges saw him in my absence Will ask him if he can complete the form.

## 2021-05-20 NOTE — Telephone Encounter (Signed)
Spoke with pt mother aware to pick up pt form from the office, state that she will pick up today, pt form placed in the front office cabinet, copy sent to scanning

## 2021-05-23 NOTE — Progress Notes (Deleted)
No chief complaint on file.   HPI: Patient  Randall Good  25 y.o. comes in today for Preventive Health Care visit   Health Maintenance  Topic Date Due   COVID-19 Vaccine (1) Never done   Pneumococcal Vaccine 81-33 Years old (1 - PCV) Never done   HPV VACCINES (1 - Male 2-dose series) Never done   HIV Screening  Never done   Hepatitis C Screening  Never done   INFLUENZA VACCINE  03/01/2021   TETANUS/TDAP  04/23/2031   Health Maintenance Review LIFESTYLE:  Exercise:   Tobacco/ETS: Alcohol:  Sugar beverages: Sleep: Drug use: no HH of  Work:    ROS:  GEN/ HEENT: No fever, significant weight changes sweats headaches vision problems hearing changes, CV/ PULM; No chest pain shortness of breath cough, syncope,edema  change in exercise tolerance. GI /GU: No adominal pain, vomiting, change in bowel habits. No blood in the stool. No significant GU symptoms. SKIN/HEME: ,no acute skin rashes suspicious lesions or bleeding. No lymphadenopathy, nodules, masses.  NEURO/ PSYCH:  No neurologic signs such as weakness numbness. No depression anxiety. IMM/ Allergy: No unusual infections.  Allergy .   REST of 12 system review negative except as per HPI   Past Medical History:  Diagnosis Date   ADD (attention deficit disorder)    possible  no eval in record   Allergic rhinitis     dr Barnetta Chapel in the past   Asthma    Eczema     No past surgical history on file.  Family History  Problem Relation Age of Onset   Bipolar disorder Father    Asthma Father    Asthma Brother     Social History   Socioeconomic History   Marital status: Unknown    Spouse name: Not on file   Number of children: Not on file   Years of education: Not on file   Highest education level: Not on file  Occupational History   Not on file  Tobacco Use   Smoking status: Never   Smokeless tobacco: Never  Vaping Use   Vaping Use: Never used  Substance and Sexual Activity   Alcohol use: No   Drug  use: No   Sexual activity: Yes    Birth control/protection: Condom  Other Topics Concern   Not on file  Social History Narrative   ** Merged History Encounter **       Parents divorced  Father [place] HH of 2 Now working  [employer] No pets Football 12h grade [place]   Social Determinants of Corporate investment banker Strain: Not on file  Food Insecurity: Not on file  Transportation Needs: Not on file  Physical Activity: Not on file  Stress: Not on file  Social Connections: Not on file    Outpatient Medications Prior to Visit  Medication Sig Dispense Refill   albuterol (VENTOLIN HFA) 108 (90 Base) MCG/ACT inhaler INHALE 2 PUFFS BY MOUTH EVERY 6 HOURS AS NEEDED FOR WHEEZING 6.7 g 1   amoxicillin-clavulanate (AUGMENTIN) 875-125 MG tablet Take 1 tablet by mouth 2 (two) times daily. 20 tablet 0   budesonide-formoterol (SYMBICORT) 160-4.5 MCG/ACT inhaler Inhale 2 puffs into the lungs 2 (two) times daily. For asthma control 1 each 4   Doxylamine-DM (VICKS DAYQUIL/NYQUIL COUGH PO) Take by mouth as needed.     No facility-administered medications prior to visit.     EXAM:  There were no vitals taken for this visit.  There is  no height or weight on file to calculate BMI. Wt Readings from Last 3 Encounters:  05/06/21 252 lb (114.3 kg)  04/22/21 245 lb (111.1 kg)  03/11/20 220 lb (99.8 kg)    Physical Exam: Vital signs reviewed [id number] is a well-developed well-nourished alert cooperative    who appearsr stated age in no acute distress.  HEENT: normocephalic atraumatic , Eyes: PERRL EOM's full, conjunctiva clear, Nares: paten,t no deformity discharge or tenderness., Ears: no deformity EAC's clear TMs with normal landmarks. Mouth: clear OP, no lesions, edema.  Moist mucous membranes. Dentition in adequate repair. NECK: supple without masses, thyromegaly or bruits. CHEST/PULM:  Clear to auscultation and percussion breath sounds equal no wheeze , rales or rhonchi. No chest wall  deformities or tenderness. Breast: normal by inspection . No dimpling, discharge, masses, tenderness or discharge . CV: PMI is nondisplaced, S1 S2 no gallops, murmurs, rubs. Peripheral pulses are full without delay.No JVD .  ABDOMEN: Bowel sounds normal nontender  No guard or rebound, no hepato splenomegal no CVA tenderness.  No hernia. Extremtities:  No clubbing cyanosis or edema, no acute joint swelling or redness no focal atrophy NEURO:  Oriented x3, cranial nerves 3-12 appear to be intact, no obvious focal weakness,gait within normal limits no abnormal reflexes or asymmetrical SKIN: No acute rashes normal turgor, color, no bruising or petechiae. PSYCH: Oriented, good eye contact, no obvious depression anxiety, cognition and judgment appear normal. LN: no cervical axillary inguinal adenopathy  Lab Results  Component Value Date   WBC 12.5 (H) 04/22/2021   HGB 14.8 04/22/2021   HCT 46.7 04/22/2021   PLT 304 04/22/2021   GLUCOSE 190 (H) 04/22/2021   CHOL 152 09/29/2009   TRIG 107.0 09/29/2009   HDL 51.20 09/29/2009   LDLCALC 79 09/29/2009   ALT 20 09/29/2009   AST 24 09/29/2009   NA 136 04/22/2021   K 3.7 04/22/2021   CL 103 04/22/2021   CREATININE 1.30 (H) 04/22/2021   BUN 12 04/22/2021   CO2 23 04/22/2021   TSH 2.59 09/29/2009   HGBA1C 5.8 09/29/2009    BP Readings from Last 3 Encounters:  05/06/21 (!) 128/92  04/27/21 (!) 130/96  04/22/21 (!) 166/76    Lab results reviewed with patient   ASSESSMENT AND PLAN:  Discussed the following assessment and plan:    ICD-10-CM   1. Visit for preventive health examination  Z00.00     2. Medication management  Z79.899     3. Uncomplicated asthma, unspecified asthma severity, unspecified whether persistent  J45.909      No follow-ups on file.  Patient Care Team: Latima Hamza, Neta Mends, MD as PCP - General (Internal Medicine) Kassiah Mccrory, Neta Mends, MD (Internal Medicine) There are no Patient Instructions on file for this  visit.  Neta Mends. Ellyson Rarick M.D.

## 2021-05-24 ENCOUNTER — Encounter: Payer: Self-pay | Admitting: Internal Medicine

## 2021-05-24 DIAGNOSIS — Z0000 Encounter for general adult medical examination without abnormal findings: Secondary | ICD-10-CM

## 2021-05-24 DIAGNOSIS — Z79899 Other long term (current) drug therapy: Secondary | ICD-10-CM

## 2021-05-24 DIAGNOSIS — J45909 Unspecified asthma, uncomplicated: Secondary | ICD-10-CM

## 2021-05-31 ENCOUNTER — Other Ambulatory Visit: Payer: Self-pay | Admitting: Internal Medicine

## 2021-06-22 ENCOUNTER — Telehealth: Payer: Self-pay | Admitting: Family Medicine
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
## 2021-06-22 NOTE — Telephone Encounter (Signed)
Pt form was received and placed on Dr Clent Ridges red folder for completion

## 2021-06-30 NOTE — Telephone Encounter (Signed)
Spoke with pt mother advised that pt form is ready for pick up at the office, state that she will come by the office to pick up, form placed in the front cabinet and a copy sent to scanning

## 2021-07-05 ENCOUNTER — Encounter: Payer: Self-pay | Admitting: Internal Medicine

## 2021-07-13 ENCOUNTER — Other Ambulatory Visit: Payer: Self-pay | Admitting: Internal Medicine

## 2021-07-19 ENCOUNTER — Other Ambulatory Visit (HOSPITAL_COMMUNITY)
Admission: RE | Admit: 2021-07-19 | Discharge: 2021-07-19 | Disposition: A | Discharge status: Home / Self Care | Payer: BC Managed Care – PPO | Source: Ambulatory Visit | Attending: Internal Medicine | Admitting: Internal Medicine

## 2021-07-19 ENCOUNTER — Ambulatory Visit (INDEPENDENT_AMBULATORY_CARE_PROVIDER_SITE_OTHER): Payer: BC Managed Care – PPO | Admitting: Internal Medicine

## 2021-07-19 ENCOUNTER — Encounter: Payer: Self-pay | Admitting: Internal Medicine

## 2021-07-19 VITALS — BP 120/84 | HR 86 | Ht 73.0 in | Wt 253.6 lb

## 2021-07-19 DIAGNOSIS — Z0000 Encounter for general adult medical examination without abnormal findings: Secondary | ICD-10-CM

## 2021-07-19 DIAGNOSIS — Z113 Encounter for screening for infections with a predominantly sexual mode of transmission: Secondary | ICD-10-CM | POA: Diagnosis present

## 2021-07-19 DIAGNOSIS — Z1159 Encounter for screening for other viral diseases: Secondary | ICD-10-CM

## 2021-07-19 DIAGNOSIS — J45909 Unspecified asthma, uncomplicated: Secondary | ICD-10-CM | POA: Diagnosis not present

## 2021-07-19 DIAGNOSIS — Z79899 Other long term (current) drug therapy: Secondary | ICD-10-CM | POA: Insufficient documentation

## 2021-07-19 MED ORDER — BUDESONIDE-FORMOTEROL FUMARATE 160-4.5 MCG/ACT IN AERO: 2.0000 | INHALATION_SPRAY | Freq: Two times a day (BID) | RESPIRATORY_TRACT | 4 refills | Status: DC

## 2021-07-19 MED ORDER — ALBUTEROL SULFATE HFA 108 (90 BASE) MCG/ACT IN AERS: INHALATION_SPRAY | RESPIRATORY_TRACT | 2 refills | Status: DC

## 2021-07-19 NOTE — Patient Instructions (Signed)
Good to see you today .  Limit sugar drinks  ( sodas and gatorade ) to 0-2 per day  to avoid getting diabetes .  Plan meals eating as much possible.   Use  symbicort contrpoller inhaler if needed rescue   3 or more times per week    or at risk season.   Will refill today .   Get a flu vaccine as  discussed .   Will notify you  of labs when available.   Get BMi below 30   to avoid  future health risk
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
## 2021-07-20 LAB — HEPATIC FUNCTION PANEL
ALT: 17 U/L (ref 0–53)
AST: 22 U/L (ref 0–37)
Albumin: 4.8 g/dL (ref 3.5–5.2)
Alkaline Phosphatase: 74 U/L (ref 39–117)
Bilirubin, Direct: 0.1 mg/dL (ref 0.0–0.3)
Total Bilirubin: 0.5 mg/dL (ref 0.2–1.2)
Total Protein: 8.3 g/dL (ref 6.0–8.3)

## 2021-07-20 LAB — BASIC METABOLIC PANEL
BUN: 16 mg/dL (ref 6–23)
CO2: 29 mEq/L (ref 19–32)
Calcium: 10.2 mg/dL (ref 8.4–10.5)
Chloride: 100 mEq/L (ref 96–112)
Creatinine, Ser: 1.14 mg/dL (ref 0.40–1.50)
GFR: 89.66 mL/min (ref >60.00)
Glucose, Bld: 88 mg/dL (ref 70–99)
Potassium: 4.1 mEq/L (ref 3.5–5.1)
Sodium: 137 mEq/L (ref 135–145)

## 2021-07-20 LAB — CBC WITH DIFFERENTIAL/PLATELET
Basophils Absolute: 0.1 10*3/uL (ref 0.0–0.1)
Basophils Relative: 1 % (ref 0.0–3.0)
Eosinophils Absolute: 0.4 10*3/uL (ref 0.0–0.7)
Eosinophils Relative: 4.7 % (ref 0.0–5.0)
HCT: 45.9 % (ref 39.0–52.0)
Hemoglobin: 14.9 g/dL (ref 13.0–17.0)
Lymphocytes Relative: 23.3 % (ref 12.0–46.0)
Lymphs Abs: 2.1 10*3/uL (ref 0.7–4.0)
MCHC: 32.4 g/dL (ref 30.0–36.0)
MCV: 82.3 fl (ref 78.0–100.0)
Monocytes Absolute: 0.4 10*3/uL (ref 0.1–1.0)
Monocytes Relative: 5 % (ref 3.0–12.0)
Neutro Abs: 5.9 10*3/uL (ref 1.4–7.7)
Neutrophils Relative %: 66 % (ref 43.0–77.0)
Platelets: 296 10*3/uL (ref 150.0–400.0)
RBC: 5.57 Mil/uL (ref 4.22–5.81)
RDW: 14.2 % (ref 11.5–15.5)
WBC: 9 10*3/uL (ref 4.0–10.5)

## 2021-07-20 LAB — LIPID PANEL
Cholesterol: 142 mg/dL (ref 0–200)
HDL: 47.3 mg/dL (ref >39.00)
LDL Cholesterol: 80 mg/dL (ref 0–99)
NonHDL: 95.05
Total CHOL/HDL Ratio: 3
Triglycerides: 77 mg/dL (ref 0.0–149.0)
VLDL: 15.4 mg/dL (ref 0.0–40.0)

## 2021-07-20 LAB — HEPATITIS C ANTIBODY
Hepatitis C Ab: NONREACTIVE
SIGNAL TO CUT-OFF: 0.02 (ref <1.00)

## 2021-07-20 LAB — HIV ANTIBODY (ROUTINE TESTING W REFLEX): HIV 1&2 Ab, 4th Generation: NONREACTIVE

## 2021-07-20 LAB — RPR: RPR Ser Ql: NONREACTIVE

## 2021-07-20 LAB — HEMOGLOBIN A1C: Hgb A1c MFr Bld: 5.7 % (ref 4.6–6.5)

## 2021-07-20 LAB — TSH: TSH: 2.68 u[IU]/mL (ref 0.35–5.50)

## 2021-07-21 LAB — URINE CYTOLOGY ANCILLARY ONLY
Chlamydia: NEGATIVE
Comment: NEGATIVE
Comment: NORMAL
Neisseria Gonorrhea: NEGATIVE

## 2021-07-21 NOTE — Progress Notes (Signed)
Results so far are good favorable cholesterol no diabetes Urine STI screen is pending. Continue working on healthy lifestyle Follow-up in a year unless needed for help with asthma or other medication

## 2021-07-21 NOTE — Progress Notes (Signed)
Negative STI screen

## 2021-09-06 ENCOUNTER — Other Ambulatory Visit: Payer: Self-pay | Admitting: Internal Medicine

## 2021-11-16 ENCOUNTER — Other Ambulatory Visit: Payer: Self-pay | Admitting: Internal Medicine

## 2022-04-02 ENCOUNTER — Other Ambulatory Visit: Payer: Self-pay | Admitting: Internal Medicine

## 2022-07-14 ENCOUNTER — Other Ambulatory Visit: Payer: Self-pay | Admitting: Internal Medicine

## 2022-08-04 ENCOUNTER — Other Ambulatory Visit: Payer: Self-pay | Admitting: Internal Medicine

## 2022-08-28 ENCOUNTER — Other Ambulatory Visit: Payer: Self-pay | Admitting: Internal Medicine

## 2022-09-26 ENCOUNTER — Other Ambulatory Visit: Payer: Self-pay | Admitting: Family

## 2023-01-19 ENCOUNTER — Other Ambulatory Visit: Payer: Self-pay | Admitting: Internal Medicine

## 2023-02-09 ENCOUNTER — Other Ambulatory Visit: Payer: Self-pay | Admitting: Family

## 2023-03-24 ENCOUNTER — Telehealth: Payer: Self-pay | Admitting: Internal Medicine

## 2023-03-24 MED ORDER — ALBUTEROL SULFATE HFA 108 (90 BASE) MCG/ACT IN AERS: INHALATION_SPRAY | RESPIRATORY_TRACT | 0 refills | Status: DC

## 2023-03-24 NOTE — Telephone Encounter (Addendum)
Prescription Request  03/24/2023  LOV:   07/19/2021  What is the name of the medication or equipment? albuterol (VENTOLIN HFA) 108 (90 Base) MCG/ACT inhaler   Have you contacted your pharmacy to request a refill? Yes   Pt reached out to the pharmacy for a refill and was told to reach out to PCP for a visit.  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  Pt has been scheduled for a VV on 04/04/23, to discuss his medication refills.  Pt is asking if MD could please send inhaler asap, and he will do his VV on 04/04/23. Also, Pt is requesting a CPE, but his only day off is Friday.  Pt is aware MD is OOO on Mondays & Fridays. Pt would like to know if he can have a CPE with another provider?  Please advise.   Which pharmacy would you like this sent to?   [id number] - [place], [place] - [address] AT [facility] [address] Phone: [phone]  Fax: [phone]      Patient notified that their request is being sent to the clinical staff for review and that they should receive a response within 2 business days.   Please advise at Mobile [phone] (mobile)

## 2023-03-24 NOTE — Telephone Encounter (Signed)
A refill is sent.  Pt has upcoming video visit on 04/04/2023.   Okay to see with another provider for CPE?

## 2023-03-28 MED ORDER — ALBUTEROL SULFATE HFA 108 (90 BASE) MCG/ACT IN AERS: INHALATION_SPRAY | RESPIRATORY_TRACT | 1 refills | Status: DC

## 2023-04-04 ENCOUNTER — Telehealth: Payer: BC Managed Care – PPO | Admitting: Internal Medicine

## 2023-04-04 NOTE — Progress Notes (Deleted)
Virtual Visit via Video Note  I connected with Randall Good on 04/04/23 at  9:45 AM EDT by a video enabled telemedicine application and verified that I am speaking with the correct person using two identifiers. Location patient: home Location provider:work office Persons participating in the virtual visit: patient, provider   Patient aware  of the limitations of evaluation and management by telemedicine and  availability of in person appointments. and agreed to proceed.   HPI: Randall Good presents for video visit for med eval for inhaler use  He is a truck driver  jx pf ase,allergy a  Last visit 12 22     ROS: See pertinent positives and negatives per HPI.  Past Medical History:  Diagnosis Date   ADD (attention deficit disorder)    possible  no eval in record   Allergic rhinitis     dr Barnetta Chapel in the past   Asthma    Eczema     No past surgical history on file.  Family History  Problem Relation Age of Onset   Bipolar disorder Father    Asthma Father    Asthma Brother     Social History   Tobacco Use   Smoking status: Never   Smokeless tobacco: Never  Vaping Use   Vaping status: Never Used  Substance Use Topics   Alcohol use: No   Drug use: No      Current Outpatient Medications:    albuterol (VENTOLIN HFA) 108 (90 Base) MCG/ACT inhaler, INHALE 2 PUFFS BY MOUTH EVERY 6 HOURS AS NEEDED FOR WHEEZING, Disp: 6.7 g, Rfl: 1   budesonide-formoterol (SYMBICORT) 160-4.5 MCG/ACT inhaler, Inhale 2 puffs into the lungs 2 (two) times daily. For asthma control, Disp: 1 each, Rfl: 4   Doxylamine-DM (VICKS DAYQUIL/NYQUIL COUGH PO), Take by mouth as needed., Disp: , Rfl:   EXAM: BP Readings from Last 3 Encounters:  07/19/21 120/84  05/06/21 (!) 128/92  04/27/21 (!) 130/96    VITALS per patient if applicable:  GENERAL: alert, oriented, appears well and in no acute distress  HEENT: atraumatic, conjunttiva clear, no obvious abnormalities on inspection of  external nose and ears  NECK: normal movements of the head and neck  LUNGS: on inspection no signs of respiratory distress, breathing rate appears normal, no obvious gross SOB, gasping or wheezing  CV: no obvious cyanosis  MS: moves all visible extremities without noticeable abnormality  PSYCH/NEURO: pleasant and cooperative, no obvious depression or anxiety, speech and thought processing grossly intact Lab Results  Component Value Date   WBC 9.0 07/19/2021   HGB 14.9 07/19/2021   HCT 45.9 07/19/2021   PLT 296.0 07/19/2021   GLUCOSE 88 07/19/2021   CHOL 142 07/19/2021   TRIG 77.0 07/19/2021   HDL 47.30 07/19/2021   LDLCALC 80 07/19/2021   ALT 17 07/19/2021   AST 22 07/19/2021   NA 137 07/19/2021   K 4.1 07/19/2021   CL 100 07/19/2021   CREATININE 1.14 07/19/2021   BUN 16 07/19/2021   CO2 29 07/19/2021   TSH 2.68 07/19/2021   HGBA1C 5.7 07/19/2021    ASSESSMENT AND PLAN:  Discussed the following assessment and plan:  No diagnosis found.  Counseled.   Expectant management and discussion of plan and treatment with opportunity to ask questions and all were answered. The patient agreed with the plan and demonstrated an understanding of the instructions.   Advised to call back or seek an in-person evaluation if worsening  or having  further concerns  in interim. No follow-ups on file.    Berniece Andreas, MD

## 2023-04-05 NOTE — Telephone Encounter (Signed)
Called Pt to schedule CPE with another provider. LVM for Pt to call us back to schedule.

## 2023-04-10 NOTE — Telephone Encounter (Signed)
Attempted to reach pt.  Voicemail is full.

## 2023-04-19 ENCOUNTER — Telehealth (INDEPENDENT_AMBULATORY_CARE_PROVIDER_SITE_OTHER): Payer: BC Managed Care – PPO | Admitting: Internal Medicine

## 2023-04-19 ENCOUNTER — Encounter: Payer: Self-pay | Admitting: Internal Medicine

## 2023-04-19 VITALS — Ht 73.0 in | Wt 231.0 lb

## 2023-04-19 DIAGNOSIS — Z79899 Other long term (current) drug therapy: Secondary | ICD-10-CM | POA: Diagnosis not present

## 2023-04-19 DIAGNOSIS — J45909 Unspecified asthma, uncomplicated: Secondary | ICD-10-CM

## 2023-04-19 DIAGNOSIS — J309 Allergic rhinitis, unspecified: Secondary | ICD-10-CM | POA: Diagnosis not present

## 2023-04-19 MED ORDER — ALBUTEROL SULFATE HFA 108 (90 BASE) MCG/ACT IN AERS: INHALATION_SPRAY | RESPIRATORY_TRACT | 1 refills | Status: DC

## 2023-04-19 MED ORDER — FLUTICASONE PROPIONATE 50 MCG/ACT NA SUSP: 2.0000 | Freq: Every day | NASAL | 5 refills | Status: AC

## 2023-04-19 MED ORDER — BUDESONIDE-FORMOTEROL FUMARATE 160-4.5 MCG/ACT IN AERO: 2.0000 | INHALATION_SPRAY | Freq: Two times a day (BID) | RESPIRATORY_TRACT | 12 refills | Status: DC

## 2023-04-19 NOTE — Progress Notes (Signed)
Virtual Visit via Video Note  I connected with Randall Good on 04/19/23 at  9:30 AM EDT by a video enabled telemedicine application and verified that I am speaking with the correct person using two identifiers. Location patient: vehicle work  Environmental education [employer] office Persons participating in the virtual visit: patient, provider  [place] national recommendations  regarding COVID 19 pandemic   video visit is advised over in office visit for this patient.  Patient aware  of the limitations of evaluation and management by telemedicine and  availability of in person appointments. and agreed to proceed.   HPI: Randall Good presents for video visit Last visit with me 12 22  for cpx  PV  He has hx of asthma eczema allergic phenom   doing ok but since growing season having more use of  albuterol rescue that does work but  using 10 - 15 x per month . 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  Taking daily antihistamine zyrtec or claritin  Never had singular as  remembers  Has been out of controller inhaler for   months long time.  Has upcoming appt with Dr Clent Ridges in my limited schedule and he  has irreg schedule otherwise  UPS   ROS: See pertinent positives and negatives per HPI.  Past Medical History:  Diagnosis Date   ADD (attention deficit disorder)    possible  no eval in record   Allergic rhinitis     dr Barnetta Chapel in the past   Asthma    Eczema     History reviewed. No pertinent surgical history.  Family History  Problem Relation Age of Onset   Bipolar disorder Father    Asthma Father    Asthma Brother     Social History   Tobacco Use   Smoking status: Never   Smokeless tobacco: Never  Vaping Use   Vaping status: Never Used  Substance Use Topics   Alcohol use: No   Drug use: No      Current Outpatient Medications:    fluticasone (FLONASE) 50 MCG/ACT nasal spray, Place 2 sprays  into both nostrils daily., Disp: 16 g, Rfl: 5   albuterol (VENTOLIN HFA) 108 (90 Base) MCG/ACT inhaler, INHALE 2 PUFFS BY MOUTH EVERY 6 HOURS AS NEEDED FOR WHEEZING, Disp: 6.7 g, Rfl: 1   budesonide-formoterol (SYMBICORT) 160-4.5 MCG/ACT inhaler, Inhale 2 puffs into the lungs 2 (two) times daily. For asthma control, Disp: 1 each, Rfl: 12  EXAM: BP Readings from Last 3 Encounters:  07/19/21 120/84  05/06/21 (!) 128/92  04/27/21 (!) 130/96    VITALS per patient if applicable:  GENERAL: alert, oriented, appears well and in no acute distress  HEENT: atraumatic, conjunttiva clear, no obvious abnormalities on inspection of external nose and ears  NECK: normal movements of the head and neck  LUNGS: on inspection no signs of respiratory distress, breathing rate appears normal, no obvious gross SOB, gasping or wheezing  CV: no obvious cyanosis  MS: moves all visible extremities without noticeable abnormality  PSYCH/NEURO: pleasant and cooperative, no obvious depression or anxiety, speech and thought processing grossly intact Lab Results  Component Value Date   WBC 9.0 07/19/2021   HGB 14.9 07/19/2021   HCT 45.9 07/19/2021   PLT 296.0 07/19/2021   GLUCOSE 88 07/19/2021   CHOL 142 07/19/2021   TRIG 77.0 07/19/2021   HDL 47.30 07/19/2021  LDLCALC 80 07/19/2021   ALT 17 07/19/2021   AST 22 07/19/2021   NA 137 07/19/2021   K 4.1 07/19/2021   CL 100 07/19/2021   CREATININE 1.14 07/19/2021   BUN 16 07/19/2021   CO2 29 07/19/2021   TSH 2.68 07/19/2021   HGBA1C 5.7 07/19/2021    ASSESSMENT AND PLAN:  Discussed the following assessment and plan:    ICD-10-CM   1. Uncomplicated asthma, unspecified asthma severity, unspecified whether persistent  J45.909     2. Medication management  Z79.899     3. Allergic rhinitis, unspecified seasonality, unspecified trigger  J30.9      Has hx of asthma allergy as a child and   notes use of inhaler rescue may be too much  Some outside  triggers  ( feeding horses)  Refill Symbicort and use 2 p bid for 2 weeks and then can dec to 2 p per day if controlled  Add INCS and cont antihistamine (  Fu in a months at  cpe visit with me or Dr Clent Ridges  consider  adding other ? Singular? Or other  IF controller not covered may need to use a different choice  Counseled.   Expectant management and discussion of plan and treatment with opportunity to ask questions and all were answered. The patient agreed with the plan and demonstrated an understanding of the instructions.   Advised to call back or seek an in-person evaluation if worsening  or having  further concerns  in interim. Return for when planned in October .   Berniece Andreas, MD
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
## 2023-05-04 ENCOUNTER — Telehealth: Payer: Self-pay | Admitting: Internal Medicine

## 2023-05-04 NOTE — Telephone Encounter (Signed)
Pt mom is calling and budesonide-formoterol (SYMBICORT) 160-4.5 MCG/ACT inhaler need PA

## 2023-05-05 ENCOUNTER — Telehealth: Payer: Self-pay

## 2023-05-05 ENCOUNTER — Other Ambulatory Visit (HOSPITAL_COMMUNITY): Payer: Self-pay

## 2023-05-05 NOTE — Telephone Encounter (Signed)
Pharmacy Patient Advocate Encounter   Received notification from Pt Calls Messages that prior authorization for Symbicort 160-4.5MCG/ACT aerosol is required/requested.   Insurance verification completed.   The patient is insured through [facility] .   Per test claim: PA required; PA submitted to [facility] via CoverMyMeds Key/confirmation #/EOC [id number] Status is pending

## 2023-05-05 NOTE — Telephone Encounter (Signed)
PA request has been Submitted. New Encounter created for follow up. For additional info see Pharmacy Prior Auth telephone encounter from 05/05/23.

## 2023-05-08 NOTE — Telephone Encounter (Signed)
Pharmacy Patient Advocate Encounter  Received notification from [facility] that Prior Authorization for Symbicort 160-4.5MCG/ACT aerosol has been APPROVED from 05/05/23 to 05/04/24   PA #/Case ID/Reference #: [id number]

## 2023-05-18 ENCOUNTER — Telehealth: Payer: Self-pay

## 2023-05-18 NOTE — Telephone Encounter (Signed)
Change to breo ellipta 100/50  1 puff per day disp 1 refill x 5

## 2023-05-18 NOTE — Telephone Encounter (Signed)
Received a fax from [facility] requesting a drug change request on Budesonide/form 160/4.25mcg  They state " Drug not covered by patient plan. The preferred alternative is Leanord Hawking, FLUTICSALMEAER, FLUTICVILANINH, BREOELLIPTA. Please call/fax the pharmacy to change medication along with strength, directions, quantity and refills."   Please advise.

## 2023-05-19 ENCOUNTER — Encounter: Payer: BC Managed Care – PPO | Admitting: Family Medicine

## 2023-05-19 MED ORDER — FLUTICASONE FUROATE-VILANTEROL 100-25 MCG/ACT IN AEPB: 1.0000 | INHALATION_SPRAY | Freq: Every day | RESPIRATORY_TRACT | 5 refills | Status: DC

## 2023-05-19 NOTE — Telephone Encounter (Signed)
Whoops  I meant 100/25 thanks

## 2023-05-19 NOTE — Telephone Encounter (Signed)
Spoke to pt and inform him that Rx was sent. Verbalized understanding.

## 2023-05-19 NOTE — Telephone Encounter (Signed)
Rx sent to pharmacy   

## 2023-05-31 ENCOUNTER — Other Ambulatory Visit: Payer: Self-pay | Admitting: Internal Medicine

## 2023-10-15 ENCOUNTER — Other Ambulatory Visit: Payer: Self-pay | Admitting: Internal Medicine

## 2023-11-30 ENCOUNTER — Other Ambulatory Visit: Payer: Self-pay | Admitting: Family

## 2023-12-08 ENCOUNTER — Telehealth: Payer: Self-pay

## 2023-12-08 NOTE — Telephone Encounter (Signed)
 The rx was routed to [facility]

## 2023-12-08 NOTE — Telephone Encounter (Signed)
 Copied from CRM [id number]. Topic: Clinical - Medication Question >> Dec 08, 2023  2:55 PM Deaijah H wrote: Reason for CRM: Patients mom would like to have Dr. Ethel Henry  budesonide -formoterol  (SYMBICORT ) 160-4.5 MCG/ACT inhaler preapproved by insurance so the whole thing can be covered.

## 2023-12-08 NOTE — Telephone Encounter (Signed)
 Contacted pharmacy and spoke to Tamisha to follow up on pt's inhaler. She states pt should be good to pick it up on Monday but unsure the cost of it.   Inform her that PA was done in 05/2023 for Symbicort  and it was approved. She states she is able to look at the co-pay.   Contacted pt and left a voicemail that his inhaler should be good but to call us  if he has any issues.

## 2024-01-24 ENCOUNTER — Other Ambulatory Visit: Payer: Self-pay | Admitting: Internal Medicine

## 2024-03-06 ENCOUNTER — Other Ambulatory Visit: Payer: Self-pay | Admitting: Internal Medicine

## 2024-04-12 ENCOUNTER — Encounter: Payer: Self-pay | Admitting: Internal Medicine

## 2024-04-22 NOTE — Telephone Encounter (Signed)
 Ok we may plan a referral to our behavior health team doing these evaluations

## 2024-04-24 ENCOUNTER — Other Ambulatory Visit: Payer: Self-pay | Admitting: Internal Medicine

## 2024-05-02 ENCOUNTER — Ambulatory Visit (INDEPENDENT_AMBULATORY_CARE_PROVIDER_SITE_OTHER): Admitting: Internal Medicine

## 2024-05-02 ENCOUNTER — Encounter: Payer: Self-pay | Admitting: Internal Medicine

## 2024-05-02 VITALS — BP 124/94 | HR 61 | Temp 98.3°F | Ht 71.65 in | Wt 278.0 lb

## 2024-05-02 DIAGNOSIS — R41840 Attention and concentration deficit: Secondary | ICD-10-CM

## 2024-05-02 DIAGNOSIS — Z9109 Other allergy status, other than to drugs and biological substances: Secondary | ICD-10-CM | POA: Diagnosis not present

## 2024-05-02 DIAGNOSIS — Z13220 Encounter for screening for lipoid disorders: Secondary | ICD-10-CM

## 2024-05-02 DIAGNOSIS — Z7689 Persons encountering health services in other specified circumstances: Secondary | ICD-10-CM

## 2024-05-02 DIAGNOSIS — Z0000 Encounter for general adult medical examination without abnormal findings: Secondary | ICD-10-CM

## 2024-05-02 DIAGNOSIS — Z79899 Other long term (current) drug therapy: Secondary | ICD-10-CM | POA: Diagnosis not present

## 2024-05-02 DIAGNOSIS — J45909 Unspecified asthma, uncomplicated: Secondary | ICD-10-CM | POA: Diagnosis not present

## 2024-05-02 LAB — BASIC METABOLIC PANEL WITH GFR
BUN: 14 mg/dL (ref 6–23)
CO2: 27 meq/L (ref 19–32)
Calcium: 9.8 mg/dL (ref 8.4–10.5)
Chloride: 104 meq/L (ref 96–112)
Creatinine, Ser: 1.06 mg/dL (ref 0.40–1.50)
GFR: 95.94 mL/min (ref >60.00)
Glucose, Bld: 99 mg/dL (ref 70–99)
Potassium: 3.9 meq/L (ref 3.5–5.1)
Sodium: 139 meq/L (ref 135–145)

## 2024-05-02 LAB — LIPID PANEL
Cholesterol: 149 mg/dL (ref 0–200)
HDL: 38.7 mg/dL — ABNORMAL LOW (ref >39.00)
LDL Cholesterol: 87 mg/dL (ref 0–99)
NonHDL: 109.88
Total CHOL/HDL Ratio: 4
Triglycerides: 115 mg/dL (ref 0.0–149.0)
VLDL: 23 mg/dL (ref 0.0–40.0)

## 2024-05-02 LAB — CBC WITH DIFFERENTIAL/PLATELET
Basophils Absolute: 0.1 K/uL (ref 0.0–0.1)
Basophils Relative: 0.8 % (ref 0.0–3.0)
Eosinophils Absolute: 0.3 K/uL (ref 0.0–0.7)
Eosinophils Relative: 4.7 % (ref 0.0–5.0)
HCT: 42.3 % (ref 39.0–52.0)
Hemoglobin: 13.8 g/dL (ref 13.0–17.0)
Lymphocytes Relative: 29.5 % (ref 12.0–46.0)
Lymphs Abs: 2.1 K/uL (ref 0.7–4.0)
MCHC: 32.6 g/dL (ref 30.0–36.0)
MCV: 79.6 fl (ref 78.0–100.0)
Monocytes Absolute: 0.4 K/uL (ref 0.1–1.0)
Monocytes Relative: 6 % (ref 3.0–12.0)
Neutro Abs: 4.2 K/uL (ref 1.4–7.7)
Neutrophils Relative %: 59 % (ref 43.0–77.0)
Platelets: 334 K/uL (ref 150.0–400.0)
RBC: 5.31 Mil/uL (ref 4.22–5.81)
RDW: 14.1 % (ref 11.5–15.5)
WBC: 7.1 K/uL (ref 4.0–10.5)

## 2024-05-02 LAB — HEPATIC FUNCTION PANEL
ALT: 17 U/L (ref 0–53)
AST: 16 U/L (ref 0–37)
Albumin: 4.6 g/dL (ref 3.5–5.2)
Alkaline Phosphatase: 94 U/L (ref 39–117)
Bilirubin, Direct: 0.1 mg/dL (ref 0.0–0.3)
Total Bilirubin: 0.5 mg/dL (ref 0.2–1.2)
Total Protein: 7.8 g/dL (ref 6.0–8.3)

## 2024-05-02 LAB — T4, FREE: Free T4: 0.71 ng/dL (ref 0.60–1.60)

## 2024-05-02 LAB — TSH: TSH: 1.67 u[IU]/mL (ref 0.35–5.50)

## 2024-05-02 LAB — HEMOGLOBIN A1C: Hgb A1c MFr Bld: 5.8 % (ref 4.6–6.5)

## 2024-05-02 MED ORDER — BUDESONIDE-FORMOTEROL FUMARATE 160-4.5 MCG/ACT IN AERO: 2.0000 | INHALATION_SPRAY | Freq: Two times a day (BID) | RESPIRATORY_TRACT | 12 refills | Status: AC

## 2024-05-02 MED ORDER — ALBUTEROL SULFATE HFA 108 (90 BASE) MCG/ACT IN AERS: INHALATION_SPRAY | RESPIRATORY_TRACT | 1 refills | Status: DC

## 2024-05-02 NOTE — Patient Instructions (Addendum)
 Good to see you today   Will get  allergy referral  as we discussed .   Need to get weight back down  BP  goal is below 140/90 and prefer 120/80 range average  Adhd sx can be   associated with sleep depreivation   and sleep apnea  anxiety and other issues. But I agree    we should  get more help with possible attention issues.   I advise referral for evaluation to tease out best intervnetions .  Asthma control can also be helped by optimizing weight.

## 2024-05-02 NOTE — Progress Notes (Signed)
 Chief Complaint  Patient presents with   Annual Exam    Pt is fasted. Pt would like to discuss adhd. Also added peanut, banana trigger his asthma.     HPI: Patient  Randall Good  28 y.o. comes in today for  pv  and fu lat visit video  9 24  pv 2022   over last 4 years   supervisor  suggested .   Could have add adhd.  Sleep  pattern is on call  .  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    Often not a set schedule .  (His Son had  similar sx . ) Neg tad  walks 45 hours .   Asthma at home fine .   But at work  in truck uses [employer] ? Exposure to peanuts and  also getting itching when eats bananas ?  Respond to inhalr  gets exposed q day .    Just ran out of symbiort .   Health Maintenance  Topic Date Due   HPV VACCINES (1 - 3-dose SCDM series) Never done   COVID-19 Vaccine (2 - Pfizer risk series) 05/18/2024 (Originally 04/22/2020)   Influenza Vaccine  10/29/2024 (Originally 03/01/2024)   Pneumococcal Vaccine (1 of 2 - PCV) 05/02/2025 (Originally 06/23/2015)   DTaP/Tdap/Td (10 - Td or Tdap) 04/23/2031   Hepatitis C Screening  Completed   HIV Screening  Completed   Meningococcal B Vaccine  Aged Out   Health Maintenance Review LIFESTYLE:  Exercise:  walking active  Tobacco/ETS:n Alcohol: n Sugar beverages: ginger ale but  turned to mostly water  Sleep: ireg jhard to go to sleep  but  irreg schedule  Drug use: no HH of 2 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 GI /GU: No adominal pain, vomiting, change in bowel habits. No blood in the stool. No significant GU symptoms. SKIN/HEME: ,no acute skin rashes suspicious lesions or bleeding. No lymphadenopathy, nodules, masses.  NEURO/ PSYCH:  No neurologic signs such as weakness numbness. No depression anxiety. IMM/ Allergy: No  unusual infections.  Allergy .   REST of 12 system review negative except as per HPI   Past Medical History:  Diagnosis Date   ADD (attention deficit disorder)    possible  no eval in record   Allergic rhinitis     dr cheryn in the past   Asthma    Eczema     History reviewed. No pertinent surgical history.  Family History  Problem Relation Age of Onset   Bipolar disorder Father    Asthma Father    Asthma Brother     Social History   Socioeconomic History   Marital status: Significant Other    Spouse name: Not on file   Number of children: Not on file   Years of education: Not on file   Highest education level: Not on file  Occupational History   Not on file  Tobacco Use   Smoking status: Never   Smokeless tobacco: Never  Vaping Use   Vaping status: Never Used  Substance and Sexual Activity   Alcohol use: No   Drug use: No   Sexual activity: Yes    Birth control/protection: Condom  Other Topics Concern   Not on file  Social History Narrative   **  Merged History Encounter **       Parents divorced  Father [place] [facility] of 2 Now working  [employer] No pets Football 12h grade [place]   Social Drivers of Corporate investment banker Strain: Not on [employer] Insecurity: Not on file  Transportation Needs: Not on file  Physical Activity: Not on file  Stress: Not on file  Social Connections: Not on file    Outpatient Medications Prior to Visit  Medication Sig Dispense Refill   albuterol  (VENTOLIN  HFA) 108 (90 Base) MCG/ACT inhaler INHALE 2 PUFFS BY MOUTH EVERY 6 HOURS AS NEEDED FOR WHEEZING 6.7 g 0   fluticasone  (FLONASE ) 50 MCG/ACT nasal spray Place 2 sprays into both nostrils daily. (Patient not taking: Reported on 05/02/2024) 16 g 5   budesonide -formoterol  (SYMBICORT ) 160-4.5 MCG/ACT inhaler Inhale 2 puffs into the lungs 2 (two) times daily. For asthma control (Patient not taking: Reported on 05/02/2024) 1 each 12   fluticasone  furoate-vilanterol (BREO ELLIPTA )  100-25 MCG/ACT AEPB Inhale 1 puff into the lungs daily. (Patient not taking: Reported on 05/02/2024) 1 each 5   No facility-administered medications prior to visit.     EXAM:  BP (!) 124/94 (BP Location: Left Arm, Patient Position: Sitting, Cuff Size: Large)   Pulse 61   Temp 98.3 F (36.8 C) (Oral)   Ht 5' 11.65 (1.82 m)   Wt 278 lb (126.1 kg)   SpO2 98%   BMI 38.07 kg/m   Body mass index is 38.07 kg/m. Wt Readings from Last 3 Encounters:  05/02/24 278 lb (126.1 kg)  04/19/23 231 lb (104.8 kg)  07/19/21 253 lb 9.6 oz (115 kg)    Physical Exam: Vital signs reviewed HZW:Uypd is a well-developed well-nourished alert cooperative    who appearsr stated age in no acute distress.  Som inc motor activity no tremor  HEENT: normocephalic atraumatic , Eyes: PERRL EOM's full, conjunctiva clear, Nares: paten,t no deformity discharge or tenderness., Ears: no deformity EAC's clear TMs with normal landmarks. Mouth: clear OP, no lesions, edema.  Moist mucous membranes. Dentition in adequate repair. NECK: supple without masses, thyromegaly or bruits. CHEST/PULM:  Clear to auscultation and percussion breath sounds equal no wheeze , rales or rhonchi. No chest wall deformities or tenderness. CV: PMI is nondisplaced, S1 S2 no gallops, murmurs, rubs. Peripheral pulses are full without delay.No JVD .  ABDOMEN: Bowel sounds normal nontender  No guard or rebound, no hepato splenomegal no CVA tenderness.  . Extremtities:  No clubbing cyanosis or edema, no acute joint swelling or redness no focal atrophy NEURO:  Oriented x3, cranial nerves 3-12 appear to be intact, no obvious focal weakness,gait within normal limits no abnormal reflexes or asymmetrical SKIN: No acute rashes normal turgor, color, no bruising or petechiae. Multiple tat on forearms  PSYCH: Oriented, good eye contact, no obvious depression anxiety, cognition and judgment appear normal. No pressured speech   .  LN: no cervical axillary   adenopathy  Lab Results  Component Value Date   WBC 9.0 07/19/2021   HGB 14.9 07/19/2021   HCT 45.9 07/19/2021   PLT 296.0 07/19/2021   GLUCOSE 88 07/19/2021   CHOL 142 07/19/2021   TRIG 77.0 07/19/2021   HDL 47.30 07/19/2021   LDLCALC 80 07/19/2021   ALT 17 07/19/2021   AST 22 07/19/2021   NA 137 07/19/2021   K 4.1 07/19/2021   CL 100 07/19/2021   CREATININE 1.14 07/19/2021   BUN 16 07/19/2021   CO2 29 07/19/2021  TSH 2.68 07/19/2021   HGBA1C 5.7 07/19/2021    BP Readings from Last 3 Encounters:  05/02/24 (!) 124/94  07/19/21 120/84  05/06/21 (!) 128/92    Lab plan reviewed with patient   ASSESSMENT AND PLAN:  Discussed the following assessment and plan:    ICD-10-CM   1. Encounter for annual physical exam  Z00.00 Basic metabolic panel with GFR    CBC with Differential/Platelet    Hemoglobin A1c    Hepatic function panel    Lipid panel    TSH    T4, Free    2. Attention and concentration deficit  R41.840 Basic metabolic panel with GFR    CBC with Differential/Platelet    Hemoglobin A1c    Hepatic function panel    Lipid panel    TSH    T4, Free    Ambulatory referral to Behavioral Health   inc motor activity sleep irreg ( with job)  sx some  as a child    3. Medication management  Z79.899 Basic metabolic panel with GFR    CBC with Differential/Platelet    Hemoglobin A1c    Hepatic function panel    Lipid panel    TSH    T4, Free    4. Uncomplicated asthma, unspecified asthma severity, unspecified whether persistent  J45.909 Basic metabolic panel with GFR    CBC with Differential/Platelet    Hemoglobin A1c    Hepatic function panel    Lipid panel    TSH    T4, Free    Ambulatory referral to Allergy    5. Screening, lipid  Z13.220 Lipid panel    6. Environmental allergies  Z91.09 Ambulatory referral to Allergy    7. Sleep concern  Z76.89 Ambulatory referral to Behavioral Health   job schedule  causes   most likely . poss underlying adhd     Bmi 38 Asthma  reasonable stable but apparent triggers  with work in truck ? If peanuts  some food sx itchy   plan allergy asthma  referral  May be mildly  lactose intolerant but not allergic sx  Need for update labs   Concern about add adhd  with increase motor activity  and sustained concentration since young child although irreg schedule and sleep  makes hard to control assess.  Father had bipolar   and he doesn't seem to have these sx .  And not obv hypomanic  He has been employed at current  work  for 6 years   and has a relationship and bought house . But supervisor commented and noted that he could have this condition. Has gained weight prob from  external factors that changed eating habits  and disc  intervention today   want to avoid getting DM .   Plan fu after evaluations  add adhd and allergy and go from there   If medication indicated   sleep and work irregularity may make more difficult to optimally  control   Declines vaccines today.   Return for after evaluation adhd and allergy . SABRA  Patient Care Team: Keidrick Murty K, MD as PCP - General (Internal Medicine) Leontina Skidmore K, MD (Internal Medicine) Patient Instructions  Good to see you today   Will get  allergy referral  as we discussed .   Need to get weight back down  BP  goal is below 140/90 and prefer 120/80 range average  Adhd sx can be   associated with sleep depreivation  and sleep apnea  anxiety and other issues. But I agree    we should  get more help with possible attention issues.   I advise referral for evaluation to tease out best intervnetions .  Asthma control can also be helped by optimizing weight.   Azlyn Wingler K. Jemia Fata M.D.

## 2024-07-02 ENCOUNTER — Other Ambulatory Visit: Payer: Self-pay | Admitting: Internal Medicine

## 2024-08-22 ENCOUNTER — Telehealth: Payer: Self-pay | Admitting: Internal Medicine

## 2024-08-22 NOTE — Telephone Encounter (Signed)
 Copied from CRM #[id number]. Topic: General - Other >> Aug 21, 2024  5:15 PM Alfonso HERO wrote: Reason for CRM: patient returning a call but doesn't know from who or why.

## 2024-08-23 ENCOUNTER — Ambulatory Visit: Admitting: Family Medicine

## 2024-08-27 ENCOUNTER — Encounter: Payer: Self-pay | Admitting: Internal Medicine

## 2024-08-27 ENCOUNTER — Telehealth: Admitting: Internal Medicine

## 2024-08-27 VITALS — Ht 71.65 in | Wt 260.0 lb

## 2024-08-27 DIAGNOSIS — R41840 Attention and concentration deficit: Secondary | ICD-10-CM

## 2024-08-27 DIAGNOSIS — Z79899 Other long term (current) drug therapy: Secondary | ICD-10-CM | POA: Diagnosis not present

## 2024-08-27 MED ORDER — LISDEXAMFETAMINE DIMESYLATE 20 MG PO CAPS: 20.0000 mg | ORAL_CAPSULE | Freq: Every day | ORAL | 0 refills | Status: AC

## 2024-08-27 NOTE — Patient Instructions (Addendum)
 Send me the information assessments that you did on line .   WIll send in starter dose of vyvanse   to take every day  is is a 10 hours med without Plan fu virtual ok after 2-3 weeks   to poss adjust dosing .   I will be out of office for over 2 weeks beginning  feb 18

## 2024-08-27 NOTE — Progress Notes (Signed)
 " Virtual Visit via Video Note  I connected with Randall Good on 08/27/24 at  4:00 PM EST by a video enabled telemedicine application and verified that I am speaking with the correct person using two identifiers. Location patient: h in his vehicle Location provider:work office Persons participating in the virtual visit: patient, provider   Patient aware  of the limitations of evaluation and management by telemedicine and  availability of in person appointments. and agreed to proceed.   HPI: [name] presents for video visit follow-up evaluation for inability to focus affecting job and life tasks.  He has been seeing his counselor to be put on the waiting list of behavioral health it required him to fill out a 200 question there online which he tried twice but had to be interrupted and could not complete i.e. that is part of the problem.  There was no way to save and come back to the detail of the questionnaire. His difficulties finishing task and focusing has been continuing interfering. His counselor does not think he has anxiety or an underlying psychiatric problem causing this. His sleep is still can be irregular and mind racing does not think he has sleep apnea when he sleeps he is able to sleep without awakening or snoring or respiratory difficulties. He states that he has done other questionnaires online and paid for these and these pointed to a diagnosis of ADHD.   ROS: See pertinent positives and negatives per HPI.  Past Medical History:  Diagnosis Date   ADD (attention deficit disorder)    possible  no eval in record   Allergic rhinitis     dr cheryn in the past   Asthma    Eczema     History reviewed. No pertinent surgical history.  Family History  Problem Relation Age of Onset   Bipolar disorder Father    Asthma Father    Asthma Brother     Social History[1]   Current Medications[2]  EXAM: BP Readings from Last 3 Encounters:  05/02/24 (!)  124/94  07/19/21 120/84  05/06/21 (!) 128/92    VITALS per patient if applicable:  GENERAL: alert, oriented, appears well and in no acute distress  HEENT: atraumatic, conjunttiva clear, no obvious abnormalities on inspection of external nose and ears  NECK: normal movements of the head and neck  LUNGS: on inspection no signs of respiratory distress, breathing rate appears normal, no obvious gross SOB, gasping or wheezing  CV: no obvious cyanosis  MS: moves all visible extremities without noticeable abnormality  PSYCH/NEURO: pleasant and cooperative, no obvious depression or anxiety, speech and thought processing grossly intact Lab Results  Component Value Date   WBC 7.1 05/02/2024   HGB 13.8 05/02/2024   HCT 42.3 05/02/2024   PLT 334.0 05/02/2024   GLUCOSE 99 05/02/2024   CHOL 149 05/02/2024   TRIG 115.0 05/02/2024   HDL 38.70 (L) 05/02/2024   LDLCALC 87 05/02/2024   ALT 17 05/02/2024   AST 16 05/02/2024   NA 139 05/02/2024   K 3.9 05/02/2024   CL 104 05/02/2024   CREATININE 1.06 05/02/2024   BUN 14 05/02/2024   CO2 27 05/02/2024   TSH 1.67 05/02/2024   HGBA1C 5.8 05/02/2024    ASSESSMENT AND PLAN:  Discussed the following assessment and plan:    ICD-10-CM   1. Attention and concentration deficit  R41.840 lisdexamfetamine  (VYVANSE ) 20 MG capsule    2. Medication management  [phone]  I agree that ADHD is in the differential is been difficult because of his work schedule is all over the place but he has difficulty maintaining concentration on any given task that is not emotional.  Paperwork has been difficult. It is ironic that it was difficult for him to complete the 200 question online questionnaire without being able to take a break or saving to have them be put in the waiting list for evaluation and behavioral health.  At this time we can try beginning a stimulant medicine with caution have him send in the information that he does have and follow-up  closely with hope for the future to get more specialty care and evaluation. At this point I do not think he is hypomanic but needs some help. Send in Vyvanse  20 mg once a day realizing its starter dose will need a prior Auth and will be generic and hopefully covered and after a few weeks we can up the dose and go from there.  Trying to avoid medication that have peaks and valleys because of his work schedule although obviously do not want to interfere with his sleep. No hx cw sleep apnea .  But maybe some sleep interruption for work and thus  aggravating some of  concentration problem    Counseled.   Expectant management and discussion of plan and treatment with opportunity to ask questions and all were answered. The patient agreed with the plan and demonstrated an understanding of the instructions.  labs from  fall are ok normal x low hdl  did not review with patient  but   no other intervnetion advised x healthy lsi  Advised to call back or seek an in-person evaluation if worsening  or having  further concerns  in interim. Return for 2-3 weeks  virtual med check.    Apolinar Eastern, MD      [1]  Social History Tobacco Use   Smoking status: Never   Smokeless tobacco: Never  Vaping Use   Vaping status: Never Used  Substance Use Topics   Alcohol use: No   Drug use: No  [2]  Current Outpatient Medications:    albuterol  (VENTOLIN  HFA) 108 (90 Base) MCG/ACT inhaler, INHALE 2 PUFFS BY MOUTH EVERY 6 HOURS AS NEEDED FOR WHEEZING, Disp: 6.7 g, Rfl: 1   budesonide -formoterol  (SYMBICORT ) 160-4.5 MCG/ACT inhaler, Inhale 2 puffs into the lungs 2 (two) times daily. For asthma control, Disp: 1 each, Rfl: 12   lisdexamfetamine  (VYVANSE ) 20 MG capsule, Take 1 capsule (20 mg total) by mouth daily., Disp: 30 capsule, Rfl: 0   fluticasone  (FLONASE ) 50 MCG/ACT nasal spray, Place 2 sprays into both nostrils daily. (Patient not taking: Reported on 08/27/2024), Disp: 16 g, Rfl: 5  "

## 2024-08-28 ENCOUNTER — Other Ambulatory Visit: Payer: Self-pay | Admitting: Internal Medicine

## 2024-08-28 ENCOUNTER — Ambulatory Visit: Admitting: Family Medicine
